# Patient Record
Sex: Female | Born: 1990 | Race: White | Hispanic: No | Marital: Single | Smoking: Current every day smoker
Health system: Southern US, Community
[De-identification: ages and names within clinical notes are randomized; demographics above are authoritative.]

## PROBLEM LIST (undated history)

## (undated) ENCOUNTER — Inpatient Hospital Stay: Payer: Self-pay

## (undated) DIAGNOSIS — F1290 Cannabis use, unspecified, uncomplicated: Secondary | ICD-10-CM

## (undated) DIAGNOSIS — E079 Disorder of thyroid, unspecified: Secondary | ICD-10-CM

## (undated) DIAGNOSIS — Z720 Tobacco use: Secondary | ICD-10-CM

## (undated) HISTORY — PX: NO PAST SURGERIES: SHX2092

## (undated) HISTORY — PX: TUBAL LIGATION: SHX77

## (undated) HISTORY — PX: WISDOM TOOTH EXTRACTION: SHX21

## (undated) HISTORY — DX: Disorder of thyroid, unspecified: E07.9

---
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
## 2010-12-19 ENCOUNTER — Inpatient Hospital Stay: Payer: Self-pay

## 2012-04-26 ENCOUNTER — Emergency Department: Payer: Self-pay | Admitting: Emergency Medicine

## 2012-04-26 LAB — CBC
HCT: 45.8 % (ref 35.0–47.0)
HGB: 14.8 g/dL (ref 12.0–16.0)
MCHC: 32.2 g/dL (ref 32.0–36.0)
MCV: 81 fL (ref 80–100)
RBC: 5.64 10*6/uL — ABNORMAL HIGH (ref 3.80–5.20)

## 2012-04-26 LAB — URINALYSIS, COMPLETE
Glucose,UR: NEGATIVE mg/dL (ref 0–75)
Ph: 6 (ref 4.5–8.0)
Protein: >=500
Specific Gravity: 1.027 (ref 1.003–1.030)
Squamous Epithelial: 15

## 2012-04-26 LAB — COMPREHENSIVE METABOLIC PANEL
Albumin: 4.3 g/dL (ref 3.4–5.0)
Alkaline Phosphatase: 158 U/L — ABNORMAL HIGH (ref 50–136)
Calcium, Total: 8.8 mg/dL (ref 8.5–10.1)
Chloride: 105 mmol/L (ref 98–107)
Co2: 24 mmol/L (ref 21–32)
Creatinine: 0.9 mg/dL (ref 0.60–1.30)
EGFR (African American): >60
EGFR (Non-African Amer.): >60
Osmolality: 276 (ref 275–301)
SGOT(AST): >2000 U/L — ABNORMAL HIGH (ref 15–37)
SGPT (ALT): >3500 U/L

## 2012-04-26 LAB — LIPASE, BLOOD: Lipase: 117 U/L (ref 73–393)

## 2012-10-09 ENCOUNTER — Ambulatory Visit: Payer: Self-pay | Admitting: Internal Medicine

## 2013-03-07 ENCOUNTER — Emergency Department: Payer: Self-pay | Admitting: Emergency Medicine

## 2013-03-07 LAB — URINALYSIS, COMPLETE
Bilirubin,UR: NEGATIVE
Glucose,UR: NEGATIVE mg/dL (ref 0–75)
Ketone: NEGATIVE
Ph: 6 (ref 4.5–8.0)
Protein: 30
RBC,UR: 6 /HPF (ref 0–5)
Specific Gravity: 1.023 (ref 1.003–1.030)

## 2013-03-07 LAB — CBC
HCT: 40.6 % (ref 35.0–47.0)
HGB: 14.1 g/dL (ref 12.0–16.0)
MCHC: 34.7 g/dL (ref 32.0–36.0)
MCV: 90 fL (ref 80–100)
Platelet: 209 10*3/uL (ref 150–440)
WBC: 12.5 10*3/uL — ABNORMAL HIGH (ref 3.6–11.0)

## 2013-03-07 LAB — WET PREP, GENITAL

## 2013-03-07 LAB — HCG, QUANTITATIVE, PREGNANCY: Beta Hcg, Quant.: 5635 m[IU]/mL — ABNORMAL HIGH

## 2013-09-07 IMAGING — US US OB < 14 WEEKS
1 series · 14 of 28 positions shown · non-contrast
Comparison: none

REASON FOR EXAM: vaginal bleeding LMP 12/14/12
COMMENTS:

PROCEDURE:     US  - US OB LESS THAN 14 WEEKS  - March 07, 2013  [DATE]
RESULT:     Limited first trimester OB ultrasound dated 03/07/2013
TECHNIQUE: Transabdominal imaging of the pelvis was obtained. This study
included duplex color flow Doppler imaging.

[Series 1: us ob < 14 weeks · 0.23mm/px · 14 of 70 slices shown]
[im 3/70]
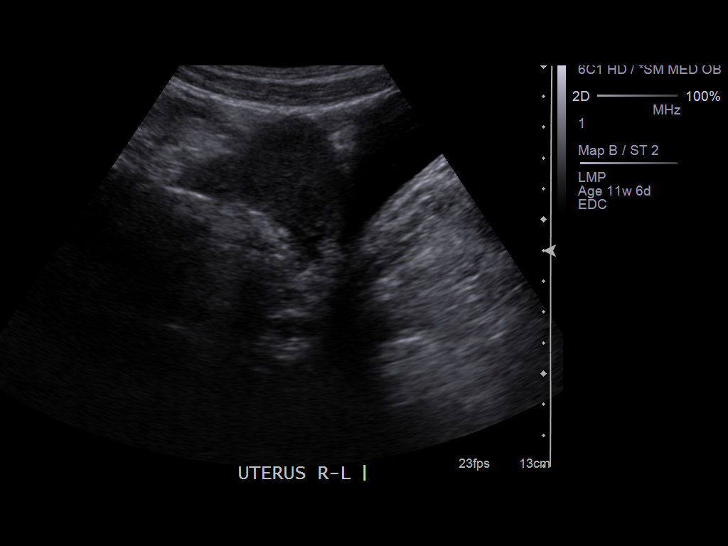
[im 8/70]
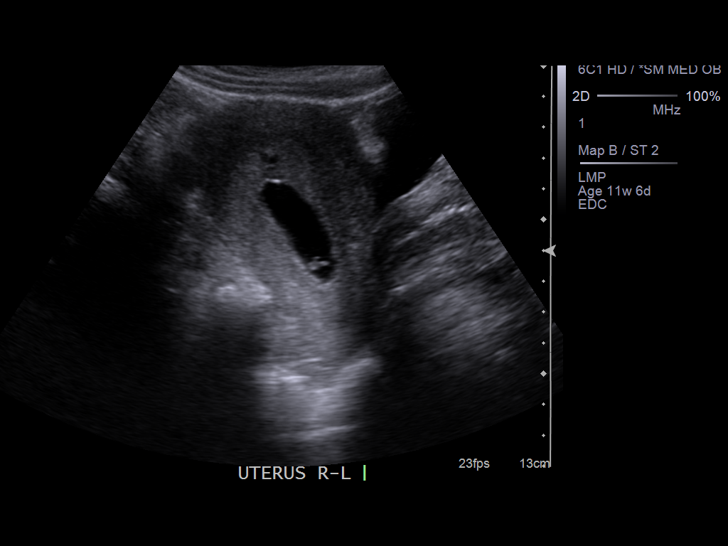
[im 13/70]
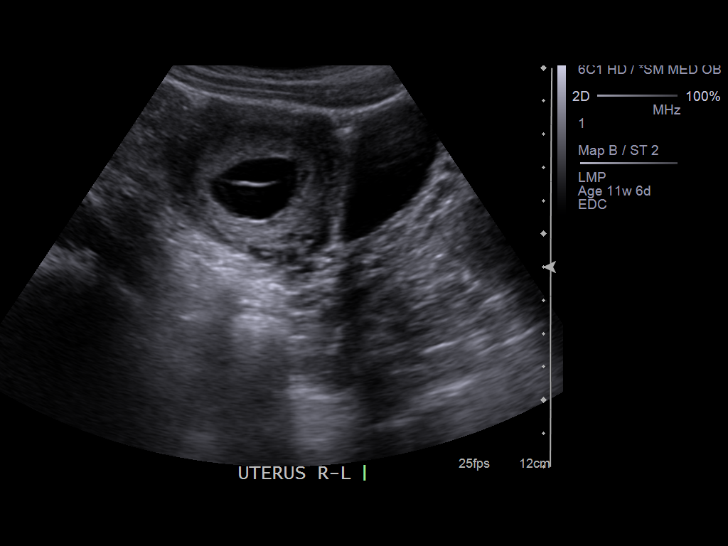
[im 18/70]
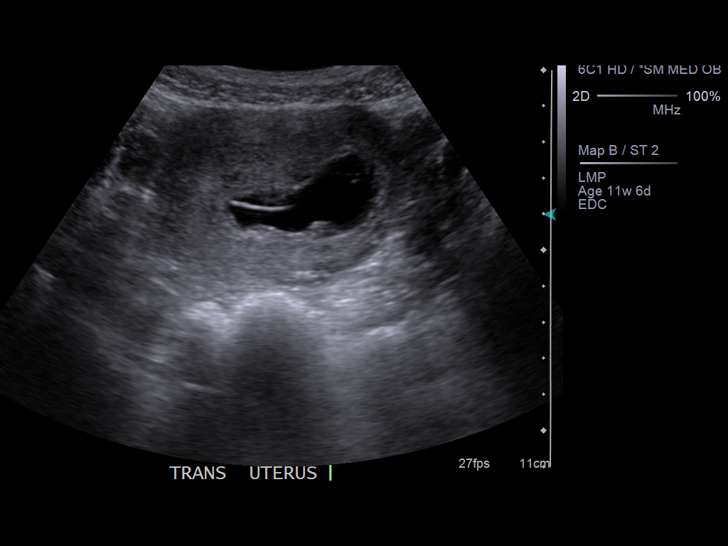
[im 24/70]
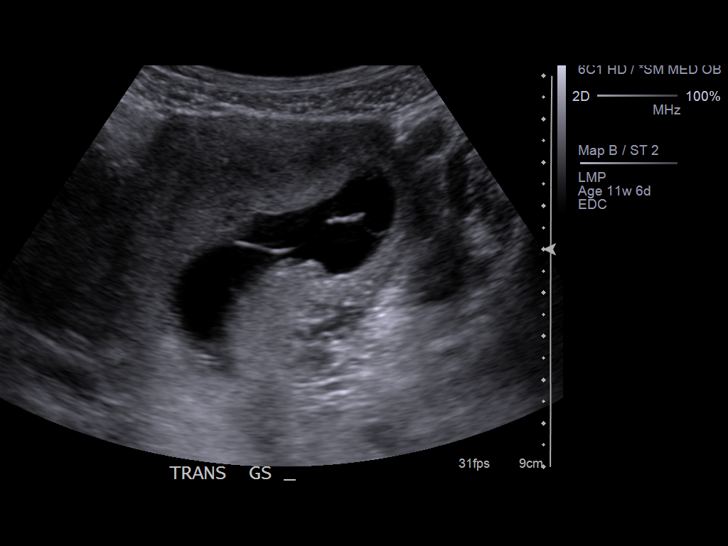
[im 29/70]
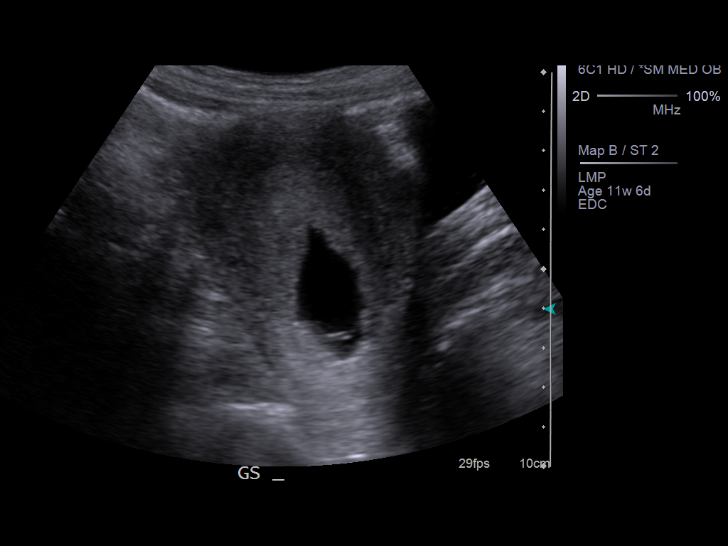
[im 34/70]
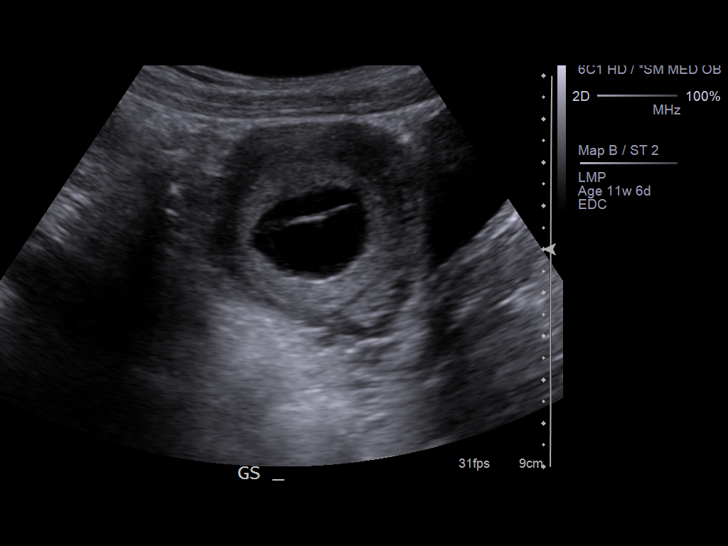
[im 39/70]
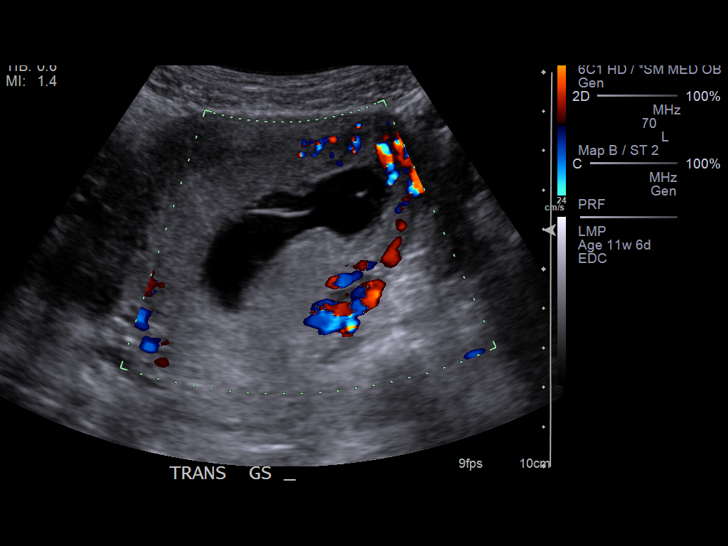
[im 44/70]
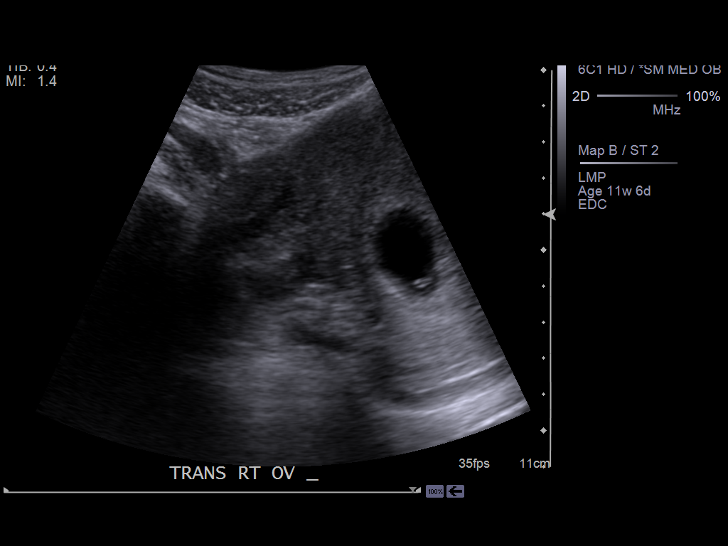
[im 49/70]
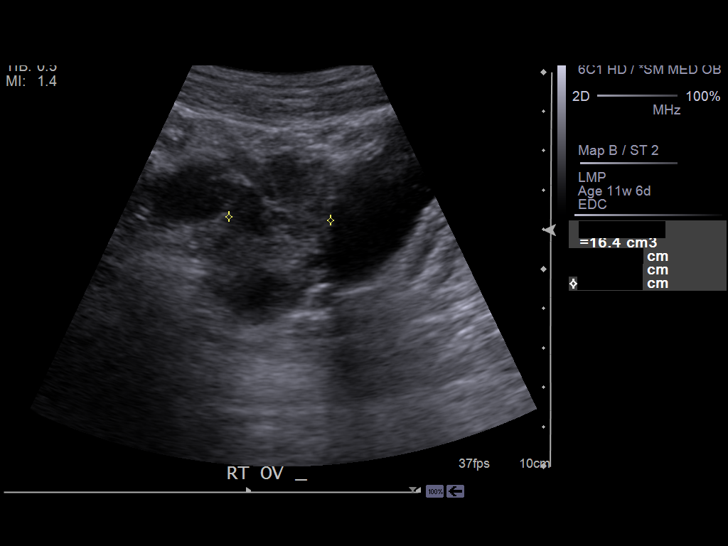
[im 54/70]
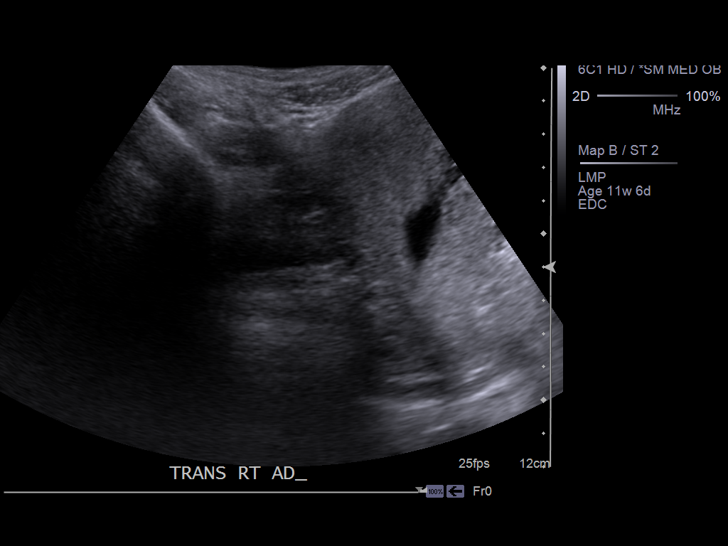
[im 59/70]
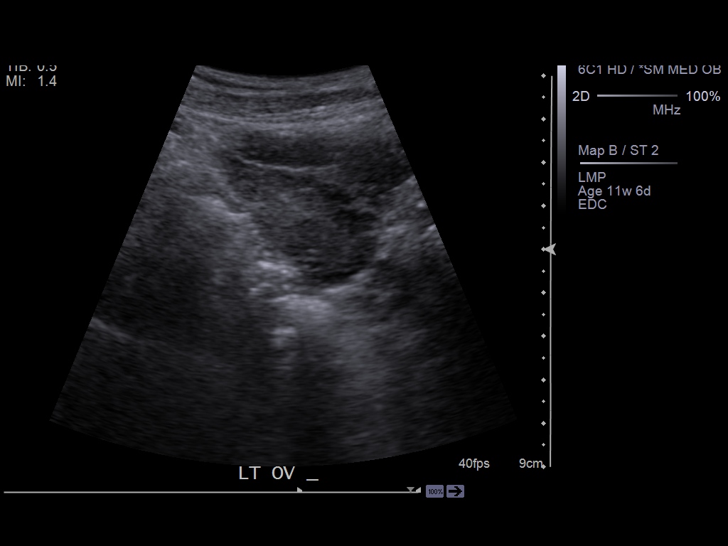
[im 64/70]
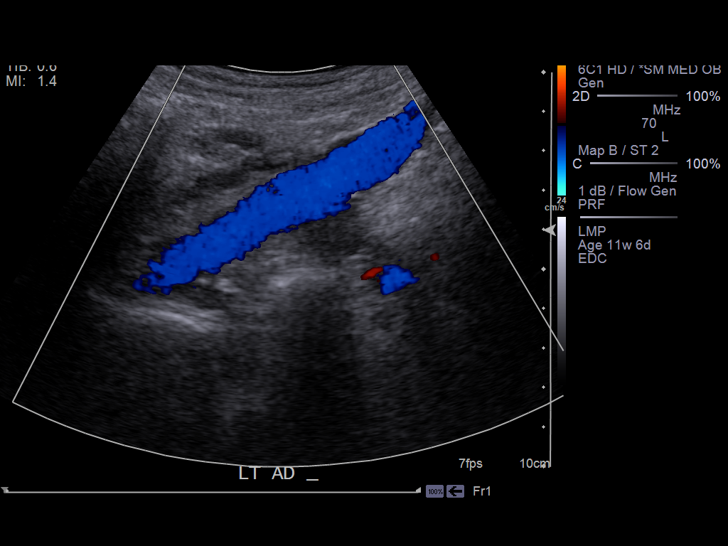
[im 70/70]
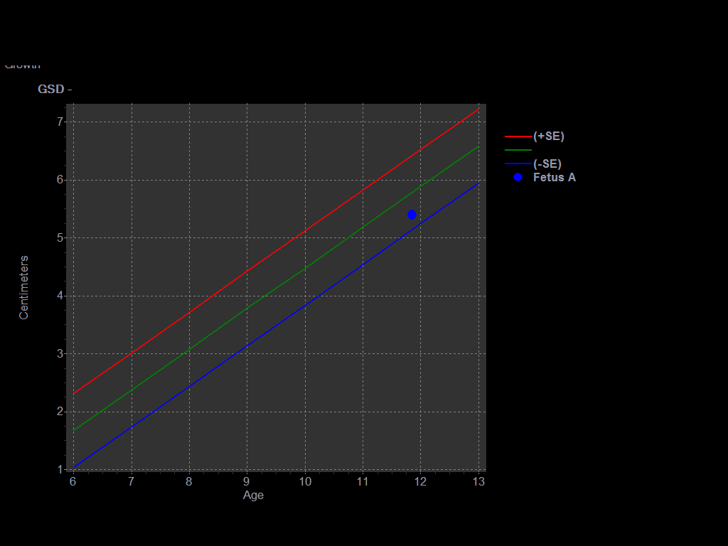

[14 of 28 positions shown; findings below may reference images not displayed]

FINDINGS: An oval shaped irregularly bordered intrauterine cystic structure
is appreciated consistent with intrauterine gestational sac. Mean sac
diameter of 5.4 cm corresponds with an estimated gestational age of 11 weeks
2 days. Internal echoes are appreciated within the gestational sac as well
as linear areas of increased echogenicity. There is no appreciable fetal
pole nor yolk sac. The uterus otherwise demonstrates a homogeneous
echotexture. The adnexal regions are unremarkable. The right ovary measures
3.04 x 4 x 2.58 cm and the left 1.77 x 2.95 x 2.27 cm. There is no evidence
of pelvic free fluid, nor solid or cystic masses.
IMPRESSION: Sonographic findings which appear to represent a
miscarriage versus an ovulatory pregnancy. Correlation with serial beta-hCG
is recommended. If clinically warranted OB consultation.

## 2014-10-28 NOTE — L&D Delivery Note (Signed)
Delivery Note At 4:49 PM a viable female was delivered via Vaginal, Spontaneous Delivery (Presentation: Left Occiput Anterior).  APGAR: 7, 9; weight 7 lb 7.8 oz (3396 g).   Placenta status: Intact, Spontaneous.  Cord: 3 vessels with the following complications: Short cord causing some decelerations with second stage pushing  Anesthesia: Epidural  Episiotomy: None Lacerations: 1st degree;Labial;Perineal Suture Repair: 3.0 chromic Est. Blood Loss (mL): 350  Mom in recovery. Baby to Couplet care / Skin to Skin. Alexis Parsons/ Bottle  Alexis Parsons 07/30/2015, 5:33 PM

## 2015-01-30 LAB — OB RESULTS CONSOLE RUBELLA ANTIBODY, IGM: RUBELLA: IMMUNE

## 2015-01-30 LAB — OB RESULTS CONSOLE ABO/RH: RH TYPE: NEGATIVE

## 2015-01-30 LAB — OB RESULTS CONSOLE HEPATITIS B SURFACE ANTIGEN: Hepatitis B Surface Ag: NEGATIVE

## 2015-01-30 LAB — OB RESULTS CONSOLE HIV ANTIBODY (ROUTINE TESTING): HIV: NONREACTIVE

## 2015-01-30 LAB — OB RESULTS CONSOLE VARICELLA ZOSTER ANTIBODY, IGG: Varicella: IMMUNE

## 2015-01-30 LAB — OB RESULTS CONSOLE RPR: RPR: NONREACTIVE

## 2015-03-28 ENCOUNTER — Encounter: Payer: Self-pay | Admitting: Certified Nurse Midwife

## 2015-03-28 ENCOUNTER — Observation Stay
Admission: EM | Admit: 2015-03-28 | Discharge: 2015-03-28 | Disposition: A | Discharge status: Home / Self Care | Payer: Medicaid Other | Attending: Obstetrics & Gynecology | Admitting: Obstetrics & Gynecology

## 2015-03-28 DIAGNOSIS — Z3A21 21 weeks gestation of pregnancy: Secondary | ICD-10-CM | POA: Diagnosis not present

## 2015-03-28 DIAGNOSIS — O4692 Antepartum hemorrhage, unspecified, second trimester: Secondary | ICD-10-CM | POA: Diagnosis not present

## 2015-03-28 DIAGNOSIS — O4690 Antepartum hemorrhage, unspecified, unspecified trimester: Secondary | ICD-10-CM | POA: Diagnosis present

## 2015-03-28 LAB — URINALYSIS COMPLETE WITH MICROSCOPIC (ARMC ONLY)
BILIRUBIN URINE: NEGATIVE
Bacteria, UA: NONE SEEN
GLUCOSE, UA: NEGATIVE mg/dL
KETONES UR: NEGATIVE mg/dL
Leukocytes, UA: NEGATIVE
Nitrite: NEGATIVE
Protein, ur: NEGATIVE mg/dL
Specific Gravity, Urine: 1.023 (ref 1.005–1.030)
pH: 6 (ref 5.0–8.0)

## 2015-03-28 LAB — CBC
HCT: 35 % (ref 35.0–47.0)
Hemoglobin: 11.7 g/dL — ABNORMAL LOW (ref 12.0–16.0)
MCH: 30.7 pg (ref 26.0–34.0)
MCHC: 33.5 g/dL (ref 32.0–36.0)
MCV: 91.6 fL (ref 80.0–100.0)
PLATELETS: 279 10*3/uL (ref 150–440)
RBC: 3.83 MIL/uL (ref 3.80–5.20)
RDW: 13 % (ref 11.5–14.5)
WBC: 10.5 10*3/uL (ref 3.6–11.0)

## 2015-03-28 LAB — CHLAMYDIA/NGC RT PCR (ARMC ONLY)
Chlamydia Tr: NOT DETECTED
N gonorrhoeae: NOT DETECTED

## 2015-03-28 LAB — KLEIHAUER-BETKE STAIN
# Vials RhIg: 1
Fetal Cells %: 0 %
Quantitation Fetal Hemoglobin: 0 mL

## 2015-03-28 LAB — TYPE AND SCREEN
ABO/RH(D): A NEG
Antibody Screen: NEGATIVE

## 2015-03-28 LAB — ABO/RH: ABO/RH(D): A NEG

## 2015-03-28 MED ORDER — RHO D IMMUNE GLOBULIN 1500 UNIT/2ML IJ SOSY
300.0000 ug | PREFILLED_SYRINGE | Freq: Once | INTRAMUSCULAR | Status: AC
  Administered 2015-03-28: 300 ug via INTRAMUSCULAR
  Filled 2015-03-28: qty 2

## 2015-03-28 MED ORDER — METRONIDAZOLE 500 MG PO TABS: 500.0000 mg | ORAL_TABLET | Freq: Two times a day (BID) | ORAL | Status: DC

## 2015-03-28 MED ORDER — ACETAMINOPHEN 325 MG PO TABS: 650.0000 mg | ORAL_TABLET | ORAL | Status: DC | PRN

## 2015-03-28 NOTE — H&P (Signed)
Obstetric History and Physical  Brittley HOORAIN KOZAKIEWICZ is a 24 y.o. G3P0011 with IUP at [redacted]w[redacted]d presenting for vaginal bleeding that started last night. She denies any recent intercourse, denies vaginal discharge, odor, itch, burn, dysuria, back pain, trauma, or vigorous activity. She reports that the bleeding was bright red with clots. Patient states she has been having  none contractions, intact membranes, with active fetal movement.    Prenatal Course Source of Care: [facility]  with onset of care at 13 weeks Pregnancy complications or risks:  Prenatal course complicated by MJ and tobacco use.  Patient Active Problem List   Diagnosis Date Noted  . Vaginal bleeding in pregnancy 03/28/2015     Prenatal labs and studies: ABO, Rh: A/Negative/-- (04/04 0000) Antibody:   Rubella: Immune (04/04 0000) Varicella: immune  RPR: Nonreactive (04/04 0000)  HBsAg: Negative (04/04 0000)  HIV: Non-reactive (04/04 0000)  GBS:  1 hr Glucola  Not performed yet Genetic screening normal Anatomy US normal Tdap: not given   Flu: not given   History reviewed. No pertinent past medical history.  History reviewed. No pertinent past surgical history.  OB History  Gravida Para Term Preterm AB SAB TAB Ectopic Multiple Living  # Outcome Date GA Lbr Len/2nd Weight Sex Delivery Anes PTL Lv  3 Current           2 AB 2014     SAB        Comments: 1st trimester   1 Gravida 12/20/10   2.892 kg (6 lb 6 oz) M Vag-Spont   Y      History   Social History  . Marital Status: Single    Spouse Name: N/A  . Number of Children: N/A  . Years of Education: N/A   Social History Main Topics  . Smoking status: Current Every Day Smoker -- 0.50 packs/day    Types: Cigarettes  . Smokeless tobacco: Not on file  . Alcohol Use: No  . Drug Use: Yes    Special: Marijuana     Comment: + UDS 01/30/15  . Sexual Activity: Yes    Birth Control/ Protection: None   Other Topics Concern  . None   Social History  Narrative  . None    Family History  Problem Relation Age of Onset  . Hyperlipidemia Father   . Hypertension Paternal Grandfather     Prescriptions prior to admission  Medication Sig Dispense Refill Last Dose  . Prenatal Vit-Fe Fumarate-FA (MULTIVITAMIN-PRENATAL) 27-0.8 MG TABS tablet Take 1 tablet by mouth daily at 12 noon.   03/27/2015 at Unknown time    No Known Allergies  Review of Systems: Negative except for what is mentioned in HPI.  Physical Exam: BP   Temp(Src) 97.8 F (36.6 C) (Oral)  Ht  (1.575 m)  Wt 52.164 kg (115 lb)  BMI 21.03 kg/m2 GENERAL: Well-developed, well-nourished female in no acute distress.  LUNGS: Clear to auscultation bilaterally.  HEART: Regular rate and rhythm. ABDOMEN: Soft, nontender, nondistended, gravid. EXTREMITIES: Nontender, no edema, 2+ distal pulses. Dilation: Closed Effacement (%): Thick Cervical Position: Middle Station: Ballotable Presentation: Undeterminable Exam by:: Galilea Quito CNM, Kaisei Gilbo Presentation: variable FHT:  Baseline rate 150 bpm   Variability moderate  Appropriate for gestational age Contractions: None seen   Pertinent Labs/Studies:   Results for orders placed or performed during the hospital encounter of 03/28/15 (from the past 24 hour(s))  Urinalysis complete, with microscopic (  [facility])     Status: Abnormal   Collection Time: 03/28/15  9:05 AM  Result Value Ref Range   Color, Urine YELLOW (A) YELLOW   APPearance CLEAR (A) CLEAR   Glucose, UA NEGATIVE NEGATIVE mg/dL   Bilirubin Urine NEGATIVE NEGATIVE   Ketones, ur NEGATIVE NEGATIVE mg/dL   Specific Gravity, Urine 1.023 1.005 - 1.030   Hgb urine dipstick 1+ (A) NEGATIVE   pH 6.0 5.0 - 8.0   Protein, ur NEGATIVE NEGATIVE mg/dL   Nitrite NEGATIVE NEGATIVE   Leukocytes, UA NEGATIVE NEGATIVE   RBC / HPF 0-5 0 - 5 RBC/hpf   WBC, UA 0-5 0 - 5 WBC/hpf   Bacteria, UA NONE SEEN NONE SEEN   Squamous Epithelial / LPF 0-5 (A) NONE SEEN   Mucous PRESENT     Microscopic wet-mount exam shows clue cells, white blood cells, thin white discharge .  Assessment : Ellamae Dwan BoltM Kittel is a 24 y.o. G3P0011 at 382w1d being seen for vaginal bleeding   Plan:  Pt with BV- will treat with flagyl Awaiting labs Will give Rhogam if indicated for vaginal bleeding with A negative blood type once labs return  Dispo: discharge after labs are back   Jannet Mantisourtney Qamar Aughenbaugh, CNM [facility]

## 2015-03-29 LAB — RHOGAM INJECTION: UNIT DIVISION: 0

## 2015-07-13 LAB — OB RESULTS CONSOLE GBS: GBS: NEGATIVE

## 2015-07-29 ENCOUNTER — Inpatient Hospital Stay
Admission: EM | Admit: 2015-07-29 | Discharge: 2015-07-31 | DRG: 775 | Disposition: A | Discharge status: Home / Self Care | Payer: Medicaid Other | Attending: Certified Nurse Midwife | Admitting: Certified Nurse Midwife

## 2015-07-29 DIAGNOSIS — Z3A38 38 weeks gestation of pregnancy: Secondary | ICD-10-CM

## 2015-07-29 DIAGNOSIS — O4292 Full-term premature rupture of membranes, unspecified as to length of time between rupture and onset of labor: Principal | ICD-10-CM | POA: Diagnosis present

## 2015-07-29 DIAGNOSIS — O99324 Drug use complicating childbirth: Secondary | ICD-10-CM | POA: Diagnosis present

## 2015-07-29 DIAGNOSIS — O4290 Premature rupture of membranes, unspecified as to length of time between rupture and onset of labor, unspecified weeks of gestation: Secondary | ICD-10-CM | POA: Diagnosis present

## 2015-07-29 DIAGNOSIS — F17210 Nicotine dependence, cigarettes, uncomplicated: Secondary | ICD-10-CM | POA: Diagnosis present

## 2015-07-29 DIAGNOSIS — O360930 Maternal care for other rhesus isoimmunization, third trimester, not applicable or unspecified: Secondary | ICD-10-CM | POA: Diagnosis present

## 2015-07-29 DIAGNOSIS — O99334 Smoking (tobacco) complicating childbirth: Secondary | ICD-10-CM | POA: Diagnosis present

## 2015-07-29 DIAGNOSIS — D62 Acute posthemorrhagic anemia: Secondary | ICD-10-CM | POA: Diagnosis not present

## 2015-07-29 DIAGNOSIS — F1290 Cannabis use, unspecified, uncomplicated: Secondary | ICD-10-CM | POA: Diagnosis present

## 2015-07-29 DIAGNOSIS — Z8249 Family history of ischemic heart disease and other diseases of the circulatory system: Secondary | ICD-10-CM

## 2015-07-29 HISTORY — DX: Tobacco use: Z72.0

## 2015-07-29 HISTORY — DX: Cannabis use, unspecified, uncomplicated: F12.90

## 2015-07-30 ENCOUNTER — Encounter: Payer: Self-pay | Admitting: Certified Nurse Midwife

## 2015-07-30 ENCOUNTER — Inpatient Hospital Stay: Payer: Medicaid Other | Admitting: Anesthesiology

## 2015-07-30 DIAGNOSIS — Z8249 Family history of ischemic heart disease and other diseases of the circulatory system: Secondary | ICD-10-CM | POA: Diagnosis not present

## 2015-07-30 DIAGNOSIS — O99324 Drug use complicating childbirth: Secondary | ICD-10-CM | POA: Diagnosis present

## 2015-07-30 DIAGNOSIS — D62 Acute posthemorrhagic anemia: Secondary | ICD-10-CM | POA: Diagnosis not present

## 2015-07-30 DIAGNOSIS — F1290 Cannabis use, unspecified, uncomplicated: Secondary | ICD-10-CM | POA: Diagnosis present

## 2015-07-30 DIAGNOSIS — O360930 Maternal care for other rhesus isoimmunization, third trimester, not applicable or unspecified: Secondary | ICD-10-CM | POA: Diagnosis present

## 2015-07-30 DIAGNOSIS — O4292 Full-term premature rupture of membranes, unspecified as to length of time between rupture and onset of labor: Secondary | ICD-10-CM | POA: Diagnosis present

## 2015-07-30 DIAGNOSIS — O99334 Smoking (tobacco) complicating childbirth: Secondary | ICD-10-CM | POA: Diagnosis present

## 2015-07-30 DIAGNOSIS — O4290 Premature rupture of membranes, unspecified as to length of time between rupture and onset of labor, unspecified weeks of gestation: Secondary | ICD-10-CM | POA: Diagnosis present

## 2015-07-30 DIAGNOSIS — Z3A38 38 weeks gestation of pregnancy: Secondary | ICD-10-CM | POA: Diagnosis not present

## 2015-07-30 DIAGNOSIS — F17210 Nicotine dependence, cigarettes, uncomplicated: Secondary | ICD-10-CM | POA: Diagnosis present

## 2015-07-30 LAB — URINE DRUG SCREEN, QUALITATIVE (ARMC ONLY)
AMPHETAMINES, UR SCREEN: NOT DETECTED
BARBITURATES, UR SCREEN: NOT DETECTED
BENZODIAZEPINE, UR SCRN: NOT DETECTED
Cannabinoid 50 Ng, Ur ~~LOC~~: NOT DETECTED
Cocaine Metabolite,Ur ~~LOC~~: NOT DETECTED
MDMA (Ecstasy)Ur Screen: NOT DETECTED
METHADONE SCREEN, URINE: NOT DETECTED
Opiate, Ur Screen: NOT DETECTED
Phencyclidine (PCP) Ur S: NOT DETECTED
TRICYCLIC, UR SCREEN: NOT DETECTED

## 2015-07-30 LAB — CBC
HEMATOCRIT: 33.1 % — AB (ref 35.0–47.0)
HEMOGLOBIN: 11.3 g/dL — AB (ref 12.0–16.0)
MCH: 27.6 pg (ref 26.0–34.0)
MCHC: 34.2 g/dL (ref 32.0–36.0)
MCV: 80.6 fL (ref 80.0–100.0)
Platelets: 218 10*3/uL (ref 150–440)
RBC: 4.1 MIL/uL (ref 3.80–5.20)
RDW: 16.3 % — ABNORMAL HIGH (ref 11.5–14.5)
WBC: 9.5 10*3/uL (ref 3.6–11.0)

## 2015-07-30 LAB — TYPE AND SCREEN
ABO/RH(D): A NEG
Antibody Screen: POSITIVE

## 2015-07-30 LAB — CHLAMYDIA/NGC RT PCR (ARMC ONLY)
CHLAMYDIA TR: NOT DETECTED
N gonorrhoeae: NOT DETECTED

## 2015-07-30 MED ORDER — BUTORPHANOL TARTRATE 1 MG/ML IJ SOLN
1.0000 mg | Freq: Once | INTRAMUSCULAR | Status: AC
  Administered 2015-07-30: 1 mg via INTRAVENOUS

## 2015-07-30 MED ORDER — FENTANYL CITRATE (PF) 100 MCG/2ML IJ SOLN: 50.0000 ug | INTRAMUSCULAR | Status: DC | PRN

## 2015-07-30 MED ORDER — FENTANYL 2.5 MCG/ML W/ROPIVACAINE 0.2% IN NS 100 ML EPIDURAL INFUSION (ARMC-ANES)
10.0000 mL/h | EPIDURAL | Status: DC
  Administered 2015-07-30: 10 mL/h via EPIDURAL

## 2015-07-30 MED ORDER — LIDOCAINE HCL (PF) 1 % IJ SOLN: 30.0000 mL | INTRAMUSCULAR | Status: DC | PRN

## 2015-07-30 MED ORDER — MEPERIDINE HCL 25 MG/ML IJ SOLN: 6.2500 mg | INTRAMUSCULAR | Status: DC | PRN

## 2015-07-30 MED ORDER — BUTORPHANOL TARTRATE 1 MG/ML IJ SOLN
INTRAMUSCULAR | Status: AC
  Administered 2015-07-30: 1 mg via INTRAVENOUS
  Filled 2015-07-30: qty 1

## 2015-07-30 MED ORDER — SCOPOLAMINE 1 MG/3DAYS TD PT72
1.0000 | MEDICATED_PATCH | Freq: Once | TRANSDERMAL | Status: DC
  Filled 2015-07-30: qty 1

## 2015-07-30 MED ORDER — MISOPROSTOL 25 MCG QUARTER TABLET
25.0000 ug | ORAL_TABLET | Freq: Once | ORAL | Status: AC
  Administered 2015-07-30: 25 ug via ORAL
  Filled 2015-07-30: qty 1

## 2015-07-30 MED ORDER — NALOXONE HCL 0.4 MG/ML IJ SOLN: 0.4000 mg | INTRAMUSCULAR | Status: DC | PRN

## 2015-07-30 MED ORDER — SODIUM CHLORIDE 0.9 % IJ SOLN: 3.0000 mL | INTRAMUSCULAR | Status: DC | PRN

## 2015-07-30 MED ORDER — AMMONIA AROMATIC IN INHA: 0.3000 mL | Freq: Once | RESPIRATORY_TRACT | Status: DC | PRN

## 2015-07-30 MED ORDER — LACTATED RINGERS IV SOLN
500.0000 mL | INTRAVENOUS | Status: DC | PRN
  Administered 2015-07-30: 500 mL via INTRAVENOUS

## 2015-07-30 MED ORDER — IBUPROFEN 600 MG PO TABS
ORAL_TABLET | ORAL | Status: AC
  Filled 2015-07-30: qty 1

## 2015-07-30 MED ORDER — OXYTOCIN BOLUS FROM INFUSION
500.0000 mL | INTRAVENOUS | Status: DC
  Administered 2015-07-30: 500 mL via INTRAVENOUS

## 2015-07-30 MED ORDER — NALBUPHINE HCL 10 MG/ML IJ SOLN
5.0000 mg | Freq: Once | INTRAMUSCULAR | Status: DC | PRN
  Filled 2015-07-30: qty 0.5

## 2015-07-30 MED ORDER — ONDANSETRON HCL 4 MG/2ML IJ SOLN: 4.0000 mg | Freq: Four times a day (QID) | INTRAMUSCULAR | Status: DC | PRN

## 2015-07-30 MED ORDER — BUPIVACAINE HCL (PF) 0.25 % IJ SOLN
INTRAMUSCULAR | Status: DC | PRN
  Administered 2015-07-30 (×2): 2.5 mL via EPIDURAL

## 2015-07-30 MED ORDER — INFLUENZA VAC SPLIT QUAD 0.5 ML IM SUSY: 0.5000 mL | PREFILLED_SYRINGE | INTRAMUSCULAR | Status: DC

## 2015-07-30 MED ORDER — LIDOCAINE HCL (PF) 1 % IJ SOLN
INTRAMUSCULAR | Status: DC | PRN
  Administered 2015-07-30: 1 mL via INTRADERMAL

## 2015-07-30 MED ORDER — NALBUPHINE HCL 10 MG/ML IJ SOLN
5.0000 mg | INTRAMUSCULAR | Status: DC | PRN
  Filled 2015-07-30: qty 0.5

## 2015-07-30 MED ORDER — SODIUM CHLORIDE 0.9 % IJ SOLN
INTRAMUSCULAR | Status: AC
  Filled 2015-07-30: qty 6

## 2015-07-30 MED ORDER — DIPHENHYDRAMINE HCL 50 MG/ML IJ SOLN
12.5000 mg | INTRAMUSCULAR | Status: DC | PRN
Start: 2015-07-30 — End: 2015-07-31

## 2015-07-30 MED ORDER — LACTATED RINGERS IV SOLN: INTRAVENOUS | Status: DC

## 2015-07-30 MED ORDER — OXYTOCIN 40 UNITS IN LACTATED RINGERS INFUSION - SIMPLE MED
62.5000 mL/h | INTRAVENOUS | Status: DC
  Administered 2015-07-30: 62.5 mL/h via INTRAVENOUS
  Filled 2015-07-30: qty 1000

## 2015-07-30 MED ORDER — TERBUTALINE SULFATE 1 MG/ML IJ SOLN: 0.2500 mg | Freq: Once | INTRAMUSCULAR | Status: DC | PRN

## 2015-07-30 MED ORDER — LIDOCAINE-EPINEPHRINE (PF) 1.5 %-1:200000 IJ SOLN
INTRAMUSCULAR | Status: DC | PRN
  Administered 2015-07-30: 3 mL via EPIDURAL

## 2015-07-30 MED ORDER — FENTANYL 2.5 MCG/ML W/ROPIVACAINE 0.2% IN NS 100 ML EPIDURAL INFUSION (ARMC-ANES)
EPIDURAL | Status: AC
  Administered 2015-07-30: 10 mL/h via EPIDURAL
  Filled 2015-07-30: qty 100

## 2015-07-30 MED ORDER — NALOXONE HCL 1 MG/ML IJ SOLN
1.0000 ug/kg/h | INTRAVENOUS | Status: DC | PRN
  Filled 2015-07-30: qty 2

## 2015-07-30 MED ORDER — IBUPROFEN 600 MG PO TABS
600.0000 mg | ORAL_TABLET | Freq: Four times a day (QID) | ORAL | Status: DC
Start: 2015-07-30 — End: 2015-07-31
  Administered 2015-07-30 – 2015-07-31 (×2): 600 mg via ORAL
  Filled 2015-07-30 (×2): qty 1

## 2015-07-30 MED ORDER — DIPHENHYDRAMINE HCL 25 MG PO CAPS: 25.0000 mg | ORAL_CAPSULE | ORAL | Status: DC | PRN

## 2015-07-30 MED ORDER — OXYTOCIN 40 UNITS IN LACTATED RINGERS INFUSION - SIMPLE MED
1.0000 m[IU]/min | INTRAVENOUS | Status: DC
  Administered 2015-07-30: 1 m[IU]/min via INTRAVENOUS

## 2015-07-30 MED ORDER — LACTATED RINGERS IV SOLN
INTRAVENOUS | Status: DC
  Administered 2015-07-30: 125 mL/h via INTRAVENOUS
  Administered 2015-07-30: 09:00:00 via INTRAVENOUS

## 2015-07-30 MED ORDER — ONDANSETRON HCL 4 MG/2ML IJ SOLN: 4.0000 mg | Freq: Three times a day (TID) | INTRAMUSCULAR | Status: DC | PRN

## 2015-07-30 MED ORDER — LACTATED RINGERS IV SOLN: 500.0000 mL | INTRAVENOUS | Status: DC | PRN

## 2015-07-30 MED ORDER — HYDROCODONE-ACETAMINOPHEN 5-325 MG PO TABS: 1.0000 | ORAL_TABLET | ORAL | Status: DC | PRN

## 2015-07-30 NOTE — Progress Notes (Signed)
L&D Progress NOte  S: Contractions getting stronger  O: BP 122/82 mmHg  Pulse 69  Temp(Src) 97.4 F (36.3 C) (Oral)  Resp 16  Ht  (1.6 m)  Wt 135 lb (61.236 kg)  BMI 23.92 kg/m2  General: in NAD FHR: 130s baseline with accels to 160s t 170, mod variability, no significant or persistent decelerations Toco: q2-7 min apart with inconsistent duration Results for orders placed or performed during the hospital encounter of 07/29/15 (from the past 24 hour(s))  CBC     Status: Abnormal   Collection Time: 07/30/15  1:58 AM  Result Value Ref Range   WBC 9.5 3.6 - 11.0 K/uL   RBC 4.10 3.80 - 5.20 MIL/uL   Hemoglobin 11.3 (L) 12.0 - 16.0 g/dL   HCT 84.6 (L) 96.2 - 95.2 %   MCV 80.6 80.0 - 100.0 fL   MCH 27.6 26.0 - 34.0 pg   MCHC 34.2 32.0 - 36.0 g/dL   RDW 84.1 (H) 32.4 - 40.1 %   Platelets 218 150 - 440 K/uL  Chlamydia/NGC rt PCR ([facility] only)     Status: None   Collection Time: 07/30/15  1:58 AM  Result Value Ref Range   Specimen source GC/Chlam ENDOCERVICAL    Chlamydia Tr NOT DETECTED NOT DETECTED   N gonorrhoeae NOT DETECTED NOT DETECTED  Type and screen     Status: None   Collection Time: 07/30/15  2:00 AM  Result Value Ref Range   ABO/RH(D) A NEG    Antibody Screen POS    Sample Expiration 08/02/2015    Antibody Identification PASSIVELY ACQUIRED ANTI-D   Urine Drug Screen, Qualitative ([facility] only)     Status: None   Collection Time: 07/30/15  3:13 AM  Result Value Ref Range   Tricyclic, Ur Screen NONE DETECTED NONE DETECTED   Amphetamines, Ur Screen NONE DETECTED NONE DETECTED   MDMA (Ecstasy)Ur Screen NONE DETECTED NONE DETECTED   Cocaine Metabolite,Ur [place] NONE DETECTED NONE DETECTED   Opiate, Ur Screen NONE DETECTED NONE DETECTED   Phencyclidine (PCP) Ur S NONE DETECTED NONE DETECTED   Cannabinoid 50 Ng, Ur [place] NONE DETECTED NONE DETECTED   Barbiturates, Ur Screen NONE DETECTED NONE DETECTED   Benzodiazepine, Ur Scrn NONE DETECTED NONE DETECTED   Methadone Scn, Ur  NONE DETECTED NONE DETECTED   A: Prolonged PROM-contracting more after first dose of Cytotec, although still irregularly  P: Can shower and eat light breakfast Repeat Cytotec 25 mcg orally after that Stadol for pain Epidural when appropriate  Ladarious Kresse, CNM

## 2015-07-30 NOTE — Progress Notes (Signed)
SVD of viable female infant. Delayed cord clamping, placed skin to skin.

## 2015-07-30 NOTE — Anesthesia Preprocedure Evaluation (Signed)
Anesthesia Evaluation  Patient identified by MRN, date of birth, ID band Patient awake    Reviewed: Allergy & Precautions, H&P , NPO status , Patient's Chart, lab work & pertinent test results  History of Anesthesia Complications Negative for: history of anesthetic complications  Airway Mallampati: III  TM Distance: >3 FB Neck ROM: full    Dental no notable dental hx. (+) Teeth Intact   Pulmonary Current Smoker,    Pulmonary exam normal breath sounds clear to auscultation       Cardiovascular (-) hypertensionnegative cardio ROS Normal cardiovascular exam Rhythm:regular Rate:Normal     Neuro/Psych negative neurological ROS  negative psych ROS   GI/Hepatic Neg liver ROS, GERD  Controlled,  Endo/Other  negative endocrine ROS  Renal/GU negative Renal ROS  negative genitourinary   Musculoskeletal   Abdominal   Peds  Hematology negative hematology ROS (+)   Anesthesia Other Findings Past Medical History:   Tobacco abuse                                                Marijuana use                                                Reproductive/Obstetrics (+) Pregnancy                             Anesthesia Physical Anesthesia Plan  ASA: II  Anesthesia Plan: Epidural   Post-op Pain Management:    Induction:   Airway Management Planned:   Additional Equipment:   Intra-op Plan:   Post-operative Plan:   Informed Consent: I have reviewed the patients History and Physical, chart, labs and discussed the procedure including the risks, benefits and alternatives for the proposed anesthesia with the patient or authorized representative who has indicated his/her understanding and acceptance.   Dental Advisory Given  Plan Discussed with: Anesthesiologist, CRNA and Surgeon  Anesthesia Plan Comments:         Anesthesia Quick Evaluation

## 2015-07-30 NOTE — Anesthesia Procedure Notes (Signed)
Epidural Patient location during procedure: OB Start time: 07/30/2015 10:48 AM End time: 07/30/2015 10:52 AM  Staffing Anesthesiologist: Margorie John K Performed by: anesthesiologist   Preanesthetic Checklist Completed: patient identified, site marked, surgical consent, pre-op evaluation, timeout performed, IV checked, risks and benefits discussed and monitors and equipment checked  Epidural Patient position: sitting Prep: Betadine Patient monitoring: heart rate, continuous pulse ox and blood pressure Approach: midline Location: L4-L5 Injection technique: LOR saline  Needle:  Needle type: Tuohy  Needle gauge: 18 G Needle length: 9 cm and 9 Needle insertion depth: 7 cm Catheter type: closed end flexible Catheter size: 20 Guage Catheter at skin depth: 11 cm Test dose: negative and 1.5% lidocaine with Epi 1:200 K  Assessment Sensory level: T8 Events: blood not aspirated, injection not painful, no injection resistance, negative IV test and no paresthesia  Additional Notes   Patient tolerated the insertion well without complications.Reason for block:procedure for pain

## 2015-07-30 NOTE — Discharge Summary (Signed)
Physician Obstetric Discharge Summary  Patient ID: Alexis Parsons MRN: [id number] DOB/AGE: [birth year] 24 y.o.   Date of Admission: 07/29/2015  Date of Discharge:   Admitting Diagnosis: Prolonged premature rupture of membranes at [redacted]w[redacted]d  Secondary Diagnosis: INadequate labor. Desires sterilization  Mode of Delivery: normal spontaneous vaginal delivery on 07/30/2015      Discharge Diagnosis: No other diagnosis   Intrapartum Procedures: pitocin augmentation, placement of intrauterine catheter and cytotec induction   Post partum procedures: none  Complications: short cord   Brief Hospital Course  Alexis Parsons is a [id number] who had a SVD on 07/30/2015;  for further details of this delivery, please refer to the delivery note.  Patient had an uncomplicated postpartum course.  By time of discharge on PPD#1, her pain was controlled on oral pain medications; she had appropriate lochia and was ambulating, voiding without difficulty and tolerating regular diet.  She was deemed stable for discharge to home.  Pt desired PP BTL, however Medicaid consent was not signed and it is listed as primary insurance. Pt will plan for interval tubal.   Labs: CBC Latest Ref Rng 07/30/2015 03/28/2015 03/07/2013  WBC 3.6 - 11.0 K/uL 9.5 10.5 12.5(H)  Hemoglobin 12.0 - 16.0 g/dL 11.3(L) 11.7(L) 14.1  Hematocrit 35.0 - 47.0 % 33.1(L) 35.0 40.6  Platelets 150 - 440 K/uL 218 279 209   A NEG / RI/ VI/ GBS negative. Physical exam:  Blood pressure 112/58, pulse 87, temperature 98.1 F (36.7 C), temperature source Oral, resp. rate 16, height  (1.6 m), weight 135 lb (61.236 kg). General: alert and no distress Lochia: appropriate Abdomen: soft, NT Uterine Fundus: firm Extremities: No evidence of DVT seen on physical exam. No lower extremity edema.  Discharge Instructions: Per After Visit Summary. Activity: Advance as tolerated. Pelvic rest for 6 weeks.  Also refer to Discharge Instructions Diet:  Regular Medications:   Medication List    Notice    You have not been prescribed any medications.     Outpatient follow up:  Postpartum contraception: bilateral tubal ligation  Discharged Condition: good  Discharged to: home   Newborn Data: Disposition:home with mother  Apgars: APGAR (1 MIN): 7   APGAR (5 MINS): 9   APGAR (10 MINS):    Baby Feeding: Bottle / Noel Gerold, CNM 07/30/2015 5:35 PM

## 2015-07-30 NOTE — H&P (Addendum)
OB History & Physical   History of Present Illness:  Chief Complaint:  24 year old G3 P1011 with EDC=08/07/2015 presents at 25 6/7 weeks with c/o leaking clear fluid since 2300 9/30. HPI:  Alexis Parsons is a 24 y.o. G24P0011 female at [redacted]w[redacted]d dated by14wk3d ultrasound.  Her pregnancy has been complicated by vaginal bleeding at 21 weeks (received Rhogam), smoking, and use of marijuana in early pregnancy..  She presents to L&D for evaluation of premature rupture of membranes over 24 hours ago. She continues to leak clear fluid, has irregular mild contractions, and denies vaginal bleeding. Prenatal care site: P[facility]atal care at [facility].       Maternal Medical History:   Past Medical History  Diagnosis Date  . Tobacco abuse   . Marijuana use     Past Surgical History  Procedure Laterality Date  . No past surgeries      No Known Allergies  Prior to Admission medications   Not on File          Social History: She  reports that she has been smoking Cigarettes.  She has been smoking about 0.50 packs per day. She has never used smokeless tobacco. She reports that she uses illicit drugs (Marijuana). She reports that she does not drink alcohol.  Family History: family history includes Hyperlipidemia in her father; Hypertension in her paternal grandfather.   Review of Systems: Negative x 10 systems reviewed except as noted in the HPI.    Results for orders placed or performed during the hospital encounter of 07/29/15 (from the past 24 hour(s))  CBC     Status: Abnormal   Collection Time: 07/30/15  1:58 AM  Result Value Ref Range   WBC 9.5 3.6 - 11.0 K/uL   RBC 4.10 3.80 - 5.20 MIL/uL   Hemoglobin 11.3 (L) 12.0 - 16.0 g/dL   HCT 16.1 (L) 09.6 - 04.5 %   MCV 80.6 80.0 - 100.0 fL   MCH 27.6 26.0 - 34.0 pg   MCHC 34.2 32.0 - 36.0 g/dL   RDW 40.9 (H) 81.1 - 91.4 %   Platelets 218 150 - 440 K/uL  Type and screen     Status: None (Preliminary result)   Collection Time:  07/30/15  2:00 AM  Result Value Ref Range   ABO/RH(D) PENDING    Antibody Screen PENDING    Sample Expiration 08/02/2015     Physical Exam:  Vital Signs: BP 125/83 mmHg  Pulse 84  Resp 18  Ht  (1.6 m)  Wt 135 lb (61.236 kg)  BMI 23.92 kg/m2 afebrile General: no acute distress.  HEENT: normocephalic, atraumatic Heart: regular rate & rhythm.  No murmurs Lungs: clear to auscultation bilaterally Abdomen: soft, gravid, non-tender;  EFW: 5#14oz Pelvic:   External: Normal external female genitalia  Cervix: Dilation: 1 / /50%/-2 per RN exam     ROM: + Nitrazine Extremities: non-tender, symmetric, noedema bilaterally.  DTRs: Neurologic: Alert & oriented x 3.    Pertinent Results:  Prenatal Labs: Blood type/Rh A negative  Antibody screen negative  Rubella immune  RPR negative  HBsAg negative  HIV negative  GC negative  Chlamydia negative  Genetic screening Tetra AFP negative  1 hour GTT 125  3 hour GTT N/A  GBS negative on 07/11/2015   Baseline FHR:130s baseline with accelerations to 150s to 160s with no decelerations and moderate variability Toco: contractions occasional, uterine irritablitiy  Assessment:  Alexis Parsons is a 24 y.o. G65P0011 female  at [redacted]w[redacted]d with Prolonged PROM    Plan:  1. Admit to Labor & Delivery   2. CBC-normal WBC 3. T&S, Clrs, IVF 4. GBS negative.   5. Consents obtained. 6. Discussed inducing labor since has been ruptured for over 24 hours. Will use Cytotec orally initially, then Pitocin when becomes more active if needed. Patient agrees with POM 7. Not in pain at this time-Fentanyl for pain and epidural when becoming more active 8. FWB: Cat 1 tracing, afebrile 9. Rh negative: did not receive Rhogam at 28 weeks. Antibody screen here pending. Rhogam post delivery if baby RH positive. 10. UDS 11. Desires pp BTL- has Media planner as well as Medicaid. Did not sign 30 day papers because of difficulty getting medicaid card...will do thru  private insurance Jerae Izard  07/30/2015 2:54 AM

## 2015-07-30 NOTE — Progress Notes (Addendum)
L&D Progress Note   S: Good relief with epidural O: BP 132/82 mmHg  Pulse 77  Temp(Src) 97.4 F (36.3 C) (Oral)  Resp 16  Ht  (1.6 m)  Wt 135 lb (61.236 kg)  BMI 23.92 kg/m2  FHR: 130 with accels to 150s. Had one deceleration to 50s > 60 sec when turned to right  Side. Resolved wih repositioning IUPC: q2-4 min apart of varying strength and duration, IUPC inserted Cervix: 5/80-90%/-1 to 0/ OP Antibody screen returned  Positive for anti-D Upon further questioning patient did remember receiving a second dose of Rhogam in the office after the dose at 21 weeks.   A: Prolonged PROM Single variable deceleration- otherwise Cat 1 strip Anti D antibody screen probably due to second dose of Rhogam.  P: Amnioinfusion if any further decelerations Pitocin augmntation when 30 min without deceleration  Skyra Crichlow, CNM

## 2015-07-31 ENCOUNTER — Encounter
Admission: EM | Disposition: A | Discharge status: Home / Self Care | Payer: Self-pay | Source: Home / Self Care | Attending: Certified Nurse Midwife

## 2015-07-31 LAB — CBC
HCT: 32.9 % — ABNORMAL LOW (ref 35.0–47.0)
Hemoglobin: 11 g/dL — ABNORMAL LOW (ref 12.0–16.0)
MCH: 27.1 pg (ref 26.0–34.0)
MCHC: 33.6 g/dL (ref 32.0–36.0)
MCV: 80.6 fL (ref 80.0–100.0)
PLATELETS: 201 10*3/uL (ref 150–440)
RBC: 4.08 MIL/uL (ref 3.80–5.20)
RDW: 16.5 % — AB (ref 11.5–14.5)
WBC: 10.2 10*3/uL (ref 3.6–11.0)

## 2015-07-31 LAB — RPR: RPR Ser Ql: NONREACTIVE

## 2015-07-31 LAB — FETAL SCREEN: Fetal Screen: NEGATIVE

## 2015-07-31 SURGERY — LIGATION, FALLOPIAN TUBE, POSTPARTUM
Anesthesia: Choice

## 2015-07-31 MED ORDER — PRENATAL MULTIVITAMIN CH
1.0000 | ORAL_TABLET | Freq: Every day | ORAL | Status: DC
  Administered 2015-07-31: 1 via ORAL
  Filled 2015-07-31: qty 1

## 2015-07-31 MED ORDER — LANOLIN HYDROUS EX OINT: TOPICAL_OINTMENT | CUTANEOUS | Status: DC | PRN

## 2015-07-31 MED ORDER — INFLUENZA VAC SPLIT QUAD 0.5 ML IM SUSY: 0.5000 mL | PREFILLED_SYRINGE | INTRAMUSCULAR | Status: DC

## 2015-07-31 MED ORDER — DOCUSATE SODIUM 100 MG PO CAPS
100.0000 mg | ORAL_CAPSULE | Freq: Two times a day (BID) | ORAL | Status: DC
  Administered 2015-07-31: 100 mg via ORAL
  Filled 2015-07-31: qty 1

## 2015-07-31 MED ORDER — SIMETHICONE 80 MG PO CHEW: 80.0000 mg | CHEWABLE_TABLET | ORAL | Status: DC | PRN

## 2015-07-31 MED ORDER — ONDANSETRON HCL 4 MG PO TABS: 4.0000 mg | ORAL_TABLET | ORAL | Status: DC | PRN

## 2015-07-31 MED ORDER — BENZOCAINE-MENTHOL 20-0.5 % EX AERO: 1.0000 "application " | INHALATION_SPRAY | CUTANEOUS | Status: DC | PRN

## 2015-07-31 MED ORDER — INFLUENZA VAC SPLIT QUAD 0.5 ML IM SUSY: 0.5000 mL | PREFILLED_SYRINGE | INTRAMUSCULAR | Status: DC | PRN

## 2015-07-31 MED ORDER — DIBUCAINE 1 % RE OINT: 1.0000 "application " | TOPICAL_OINTMENT | RECTAL | Status: DC | PRN

## 2015-07-31 MED ORDER — WITCH HAZEL-GLYCERIN EX PADS: 1.0000 "application " | MEDICATED_PAD | CUTANEOUS | Status: DC | PRN

## 2015-07-31 MED ORDER — RHO D IMMUNE GLOBULIN 1500 UNIT/2ML IJ SOSY
300.0000 ug | PREFILLED_SYRINGE | Freq: Once | INTRAMUSCULAR | Status: AC
  Administered 2015-07-31: 300 ug via INTRAMUSCULAR
  Filled 2015-07-31: qty 2

## 2015-07-31 MED ORDER — ONDANSETRON HCL 4 MG/2ML IJ SOLN: 4.0000 mg | INTRAMUSCULAR | Status: DC | PRN

## 2015-07-31 MED ORDER — FERROUS SULFATE 325 (65 FE) MG PO TABS
325.0000 mg | ORAL_TABLET | Freq: Every day | ORAL | Status: DC
  Administered 2015-07-31: 325 mg via ORAL
  Filled 2015-07-31: qty 1

## 2015-07-31 MED ORDER — OXYTOCIN 40 UNITS IN LACTATED RINGERS INFUSION - SIMPLE MED
62.5000 mL/h | INTRAVENOUS | Status: DC | PRN
  Administered 2015-07-31: 62.5 mL/h via INTRAVENOUS
  Filled 2015-07-31: qty 1000

## 2015-07-31 NOTE — Progress Notes (Signed)
Pt discharged home with infant.  Discharge instructions and follow up appointment given to and reviewed with pt.  Pt verbalized understanding.  Escorted by auxillary. 

## 2015-07-31 NOTE — Anesthesia Postprocedure Evaluation (Signed)
  Anesthesia Post-op Note  Patient: Alexis Parsons  Procedure(s) Performed: * No procedures listed *  Anesthesia type:No value filed.  Patient location: 346  Post pain: Pain level controlled  Post assessment: Post-op Vital signs reviewed, Patient's Cardiovascular Status Stable, Respiratory Function Stable, Patent Airway and No signs of Nausea or vomiting  Post vital signs: Reviewed and stable  Last Vitals:  Filed Vitals:   07/31/15 0722  BP: 122/84  Pulse: 67  Temp: 36.6 C  Resp: 18    Level of consciousness: awake, alert  and patient cooperative  Complications: No apparent anesthesia complications

## 2015-07-31 NOTE — Progress Notes (Signed)
Subjective:  Doing well minimal lochia, no fevers, no chills.  Objective:   Blood pressure 122/84, pulse 67, temperature 97.9 F (36.6 C), temperature source Oral, resp. rate 18, height  (1.6 m), weight 61.236 kg (135 lb), SpO2 99 %, unknown if currently breastfeeding.  General: NAD Pulmonary: no increased work of breathing Abdomen: non-distended, non-tender, fundus firm at level of umbilicus Extremities: no edema, no erythema, no tenderness  Results for orders placed or performed during the hospital encounter of 07/29/15 (from the past 72 hour(s))  CBC     Status: Abnormal   Collection Time: 07/30/15  1:58 AM  Result Value Ref Range   WBC 9.5 3.6 - 11.0 K/uL   RBC 4.10 3.80 - 5.20 MIL/uL   Hemoglobin 11.3 (L) 12.0 - 16.0 g/dL   HCT 40.9 (L) 81.1 - 91.4 %   MCV 80.6 80.0 - 100.0 fL   MCH 27.6 26.0 - 34.0 pg   MCHC 34.2 32.0 - 36.0 g/dL   RDW 78.2 (H) 95.6 - 21.3 %   Platelets 218 150 - 440 K/uL  Chlamydia/NGC rt PCR ([facility] only)     Status: None   Collection Time: 07/30/15  1:58 AM  Result Value Ref Range   Specimen source GC/Chlam ENDOCERVICAL    Chlamydia Tr NOT DETECTED NOT DETECTED   N gonorrhoeae NOT DETECTED NOT DETECTED    Comment: (NOTE) 100  This methodology has not been evaluated in pregnant women or in 200  patients with a history of hysterectomy. 300 400  This methodology will not be performed on patients less than 65  years of age.   Type and screen     Status: None   Collection Time: 07/30/15  2:00 AM  Result Value Ref Range   ABO/RH(D) A NEG    Antibody Screen POS    Sample Expiration 08/02/2015    Antibody Identification PASSIVELY ACQUIRED ANTI-D   Urine Drug Screen, Qualitative ([facility] only)     Status: None   Collection Time: 07/30/15  3:13 AM  Result Value Ref Range   Tricyclic, Ur Screen NONE DETECTED NONE DETECTED   Amphetamines, Ur Screen NONE DETECTED NONE DETECTED   MDMA (Ecstasy)Ur Screen NONE DETECTED NONE DETECTED   Cocaine  Metabolite,Ur Everetts NONE DETECTED NONE DETECTED   Opiate, Ur Screen NONE DETECTED NONE DETECTED   Phencyclidine (PCP) Ur S NONE DETECTED NONE DETECTED   Cannabinoid 50 Ng, Ur Bradley NONE DETECTED NONE DETECTED   Barbiturates, Ur Screen NONE DETECTED NONE DETECTED   Benzodiazepine, Ur Scrn NONE DETECTED NONE DETECTED   Methadone Scn, Ur NONE DETECTED NONE DETECTED    Comment: (NOTE) 100  Tricyclics, urine               Cutoff 1000 ng/mL 200  Amphetamines, urine             Cutoff 1000 ng/mL 300  MDMA (Ecstasy), urine           Cutoff 500 ng/mL 400  Cocaine Metabolite, urine       Cutoff 300 ng/mL 500  Opiate, urine                   Cutoff 300 ng/mL 600  Phencyclidine (PCP), urine      Cutoff 25 ng/mL 700  Cannabinoid, urine              Cutoff 50 ng/mL 800  Barbiturates, urine             Cutoff  200 ng/mL 900  Benzodiazepine, urine           Cutoff 200 ng/mL 1000 Methadone, urine                Cutoff 300 ng/mL 1100 1200 The urine drug screen provides only a preliminary, unconfirmed 1300 analytical test result and should not be used for non-medical 1400 purposes. Clinical consideration and professional judgment should 1500 be applied to any positive drug screen result due to possible 1600 interfering substances. A more specific alternate chemical method 1700 must be used in order to obtain a confirmed analytical result.  1800 Gas chromato graphy / mass spectrometry (GC/MS) is the preferred 1900 confirmatory method.   CBC     Status: Abnormal   Collection Time: 07/31/15  7:12 AM  Result Value Ref Range   WBC 10.2 3.6 - 11.0 K/uL   RBC 4.08 3.80 - 5.20 MIL/uL   Hemoglobin 11.0 (L) 12.0 - 16.0 g/dL   HCT 91.4 (L) 78.2 - 95.6 %   MCV 80.6 80.0 - 100.0 fL   MCH 27.1 26.0 - 34.0 pg   MCHC 33.6 32.0 - 36.0 g/dL   RDW 21.3 (H) 08.6 - 57.8 %   Platelets 201 150 - 440 K/uL   Information for the patient's newborn:  Mckala, Pantaleon [[id number]]  A POS   Assessment:   24 y.o. [id number]  postpartum day # 1 TSVD  Plan:    1) Acute blood loss anemia - hemodynamically stable and asymptomatic - po ferrous sulfate  2) A neg / RI  - needs rhogam   4) BTL - discussed permanent nature of procedures 1% failure rate, as well as regret in patient under 30.  Patient opts to proceed with BTL.  No 30 day paper will verify can bill through secondary insurance  5) Disposition anticipated discharge PPD#2

## 2015-07-31 NOTE — Discharge Instructions (Signed)
Discharge instructions:  ° °Call office if you have any of the following: headache, visual changes, fever >100 F, chills, breast concerns, excessive vaginal bleeding, incision drainage or problems, leg pain or redness, depression or any other concerns.  ° °Activity: Do not lift > 10 lbs for 6 weeks.  °No intercourse or tampons for 6 weeks.  °No driving for 1-2 weeks.  ° °

## 2015-08-02 LAB — RHOGAM INJECTION: Unit division: 0

## 2015-09-01 ENCOUNTER — Encounter
Admission: RE | Admit: 2015-09-01 | Discharge: 2015-09-01 | Disposition: A | Discharge status: Home / Self Care | Payer: Medicaid Other | Source: Ambulatory Visit | Attending: Obstetrics and Gynecology | Admitting: Obstetrics and Gynecology

## 2015-09-01 DIAGNOSIS — Z01818 Encounter for other preprocedural examination: Secondary | ICD-10-CM | POA: Insufficient documentation

## 2015-09-01 NOTE — Patient Instructions (Signed)
  Your procedure is scheduled on: Thursday Nov. 10, 2016. Report to Same Day Surgery. To find out your arrival time please call [phone] between 1PM - 3PM on Wednesday September 06, 2015.  Remember: Instructions that are not followed completely may result in serious medical risk, up to and including death, or upon the discretion of your surgeon and anesthesiologist your surgery may need to be rescheduled.    _x___ 1. Do not eat food or drink liquids after midnight. No gum chewing or hard candies.     ____ 2. No Alcohol for 24 hours before or after surgery.   ____ 3. Bring all medications with you on the day of surgery if instructed.    __x__ 4. Notify your doctor if there is any change in your medical condition     (cold, fever, infections).     Do not wear jewelry, make-up, hairpins, clips or nail polish.  Do not wear lotions, powders, or perfumes. You may wear deodorant.  Do not shave 48 hours prior to surgery. Men may shave face and neck.  Do not bring valuables to the hospital.    [facility] not [facility] for any belongings or valuables.               Contacts, dentures or bridgework may not be worn into surgery.  Leave your suitcase in the car. After surgery it may be brought to your room.  For patients admitted to the hospital, discharge time is determined by your treatment team.   Patients discharged the day of surgery will not be allowed to drive home.    Please read over the following fact sheets that you were given:   [facility]  ____ Take these medicines the morning of surgery with A SIP OF WATER: None    ____ Fleet Enema (as directed)   _x___ Use CHG Soap as directed  ____ Use inhalers on the day of surgery  ____ Stop metformin 2 days prior to surgery    ____ Take 1/2 of usual insulin dose the night before surgery and none on the morning of  surgery.   ____ Stop Coumadin/Plavix/aspirin on does not apply.  __x__ Stop  Anti-inflammatories such as ibuprofen.  OK to take Tylenol for pain.   ____ Stop supplements until after surgery.    ____ Bring C-Pap to the hospital.

## 2015-09-07 ENCOUNTER — Encounter
Admission: RE | Disposition: A | Discharge status: Home / Self Care | Payer: Self-pay | Source: Ambulatory Visit | Attending: Obstetrics and Gynecology

## 2015-09-07 ENCOUNTER — Ambulatory Visit: Payer: BLUE CROSS/BLUE SHIELD | Admitting: Anesthesiology

## 2015-09-07 ENCOUNTER — Encounter: Payer: Self-pay | Admitting: *Deleted

## 2015-09-07 ENCOUNTER — Ambulatory Visit
Admission: RE | Admit: 2015-09-07 | Discharge: 2015-09-07 | Disposition: A | Discharge status: Home / Self Care | Payer: BLUE CROSS/BLUE SHIELD | Source: Ambulatory Visit | Attending: Obstetrics and Gynecology | Admitting: Obstetrics and Gynecology

## 2015-09-07 DIAGNOSIS — Z8249 Family history of ischemic heart disease and other diseases of the circulatory system: Secondary | ICD-10-CM | POA: Insufficient documentation

## 2015-09-07 DIAGNOSIS — Z302 Encounter for sterilization: Secondary | ICD-10-CM | POA: Insufficient documentation

## 2015-09-07 DIAGNOSIS — Z79899 Other long term (current) drug therapy: Secondary | ICD-10-CM | POA: Diagnosis not present

## 2015-09-07 DIAGNOSIS — F17210 Nicotine dependence, cigarettes, uncomplicated: Secondary | ICD-10-CM | POA: Insufficient documentation

## 2015-09-07 HISTORY — PX: LAPAROSCOPIC TUBAL LIGATION: SHX1937

## 2015-09-07 LAB — POCT PREGNANCY, URINE: Preg Test, Ur: NEGATIVE

## 2015-09-07 SURGERY — LIGATION, FALLOPIAN TUBE, LAPAROSCOPIC
Anesthesia: General

## 2015-09-07 MED ORDER — SUGAMMADEX SODIUM 200 MG/2ML IV SOLN
INTRAVENOUS | Status: DC | PRN
  Administered 2015-09-07: 100 mg via INTRAVENOUS

## 2015-09-07 MED ORDER — LABETALOL HCL 5 MG/ML IV SOLN
5.0000 mg | Freq: Once | INTRAVENOUS | Status: AC
  Administered 2015-09-07: 5 mg via INTRAVENOUS

## 2015-09-07 MED ORDER — IBUPROFEN 600 MG PO TABS: 600.0000 mg | ORAL_TABLET | Freq: Four times a day (QID) | ORAL | Status: DC | PRN

## 2015-09-07 MED ORDER — OXYCODONE-ACETAMINOPHEN 5-325 MG PO TABS: 1.0000 | ORAL_TABLET | Freq: Four times a day (QID) | ORAL | Status: DC | PRN

## 2015-09-07 MED ORDER — LACTATED RINGERS IV SOLN
INTRAVENOUS | Status: DC
  Administered 2015-09-07: 15:00:00 via INTRAVENOUS

## 2015-09-07 MED ORDER — FAMOTIDINE 20 MG PO TABS
20.0000 mg | ORAL_TABLET | Freq: Once | ORAL | Status: AC
  Administered 2015-09-07: 20 mg via ORAL

## 2015-09-07 MED ORDER — FENTANYL CITRATE (PF) 100 MCG/2ML IJ SOLN
INTRAMUSCULAR | Status: DC | PRN
  Administered 2015-09-07: 50 ug via INTRAVENOUS
  Administered 2015-09-07: 150 ug via INTRAVENOUS
  Administered 2015-09-07: 50 ug via INTRAVENOUS

## 2015-09-07 MED ORDER — DEXAMETHASONE SODIUM PHOSPHATE 4 MG/ML IJ SOLN
INTRAMUSCULAR | Status: DC | PRN
  Administered 2015-09-07: 5 mg via INTRAVENOUS

## 2015-09-07 MED ORDER — ROCURONIUM BROMIDE 100 MG/10ML IV SOLN
INTRAVENOUS | Status: DC | PRN
  Administered 2015-09-07: 30 mg via INTRAVENOUS

## 2015-09-07 MED ORDER — PROPOFOL 10 MG/ML IV BOLUS
INTRAVENOUS | Status: DC | PRN
  Administered 2015-09-07: 180 mg via INTRAVENOUS

## 2015-09-07 MED ORDER — LIDOCAINE HCL (CARDIAC) 20 MG/ML IV SOLN
INTRAVENOUS | Status: DC | PRN
  Administered 2015-09-07: 50 mg via INTRAVENOUS

## 2015-09-07 MED ORDER — FENTANYL CITRATE (PF) 100 MCG/2ML IJ SOLN
INTRAMUSCULAR | Status: AC
  Filled 2015-09-07: qty 2

## 2015-09-07 MED ORDER — ONDANSETRON HCL 4 MG/2ML IJ SOLN
INTRAMUSCULAR | Status: DC | PRN
  Administered 2015-09-07: 4 mg via INTRAVENOUS

## 2015-09-07 MED ORDER — LABETALOL HCL 5 MG/ML IV SOLN
INTRAVENOUS | Status: AC
  Filled 2015-09-07: qty 4

## 2015-09-07 MED ORDER — HYDRALAZINE HCL 20 MG/ML IJ SOLN
INTRAMUSCULAR | Status: AC
  Filled 2015-09-07: qty 1

## 2015-09-07 MED ORDER — KETOROLAC TROMETHAMINE 30 MG/ML IJ SOLN
INTRAMUSCULAR | Status: DC | PRN
  Administered 2015-09-07: 30 mg via INTRAVENOUS

## 2015-09-07 MED ORDER — HYDRALAZINE HCL 20 MG/ML IJ SOLN
10.0000 mg | Freq: Once | INTRAMUSCULAR | Status: AC
  Administered 2015-09-07: 10 mg via INTRAVENOUS

## 2015-09-07 MED ORDER — MIDAZOLAM HCL 2 MG/2ML IJ SOLN
INTRAMUSCULAR | Status: DC | PRN
  Administered 2015-09-07: 2 mg via INTRAVENOUS

## 2015-09-07 MED ORDER — ONDANSETRON HCL 4 MG/2ML IJ SOLN: 4.0000 mg | Freq: Once | INTRAMUSCULAR | Status: DC | PRN

## 2015-09-07 MED ORDER — FENTANYL CITRATE (PF) 100 MCG/2ML IJ SOLN
25.0000 ug | INTRAMUSCULAR | Status: DC | PRN
  Administered 2015-09-07 (×2): 25 ug via INTRAVENOUS

## 2015-09-07 SURGICAL SUPPLY — 19 items
BLADE SURG SZ11 CARB STEEL (BLADE) ×3 IMPLANT
CATH ROBINSON RED A/P 16FR (CATHETERS) ×3 IMPLANT
CHLORAPREP W/TINT 26ML (MISCELLANEOUS) ×3 IMPLANT
DRSG TEGADERM 2-3/8X2-3/4 SM (GAUZE/BANDAGES/DRESSINGS) ×3 IMPLANT
GLOVE BIO SURGEON STRL SZ7 (GLOVE) ×3 IMPLANT
GLOVE INDICATOR 7.5 STRL GRN (GLOVE) ×3 IMPLANT
GOWN STRL REUS W/ TWL LRG LVL3 (GOWN DISPOSABLE) ×2 IMPLANT
GOWN STRL REUS W/TWL LRG LVL3 (GOWN DISPOSABLE) ×4
KIT DISPOSABLE FALLOPE RING (Ring) ×3 IMPLANT
KIT RM TURNOVER CYSTO AR (KITS) ×3 IMPLANT
LABEL OR SOLS (LABEL) ×3 IMPLANT
LIQUID BAND (GAUZE/BANDAGES/DRESSINGS) ×3 IMPLANT
NS IRRIG 500ML POUR BTL (IV SOLUTION) ×3 IMPLANT
PACK GYN LAPAROSCOPIC (MISCELLANEOUS) ×3 IMPLANT
PAD OB MATERNITY 4.3X12.25 (PERSONAL CARE ITEMS) ×3 IMPLANT
PAD PREP 24X41 OB/GYN DISP (PERSONAL CARE ITEMS) ×3 IMPLANT
SUT MNCRL AB 4-0 PS2 18 (SUTURE) ×3 IMPLANT
TROCAR XCEL NON-BLD 5MMX100MML (ENDOMECHANICALS) ×3 IMPLANT
TUBING INSUFFLATOR HI FLOW (MISCELLANEOUS) ×3 IMPLANT

## 2015-09-07 NOTE — Transfer of Care (Signed)
Immediate Anesthesia Transfer of Care Note  Patient: Alexis FernsChloe M Mersereau  Procedure(s) Performed: Procedure(s): LAPAROSCOPIC TUBAL LIGATION (N/A)  Patient Location: PACU  Anesthesia Type:General  Level of Consciousness: awake  Airway & Oxygen Therapy: Patient Spontanous Breathing and Patient connected to face mask oxygen  Post-op Assessment: Report given to RN  Post vital signs: Reviewed  Last Vitals:  Filed Vitals:   09/07/15 1859  BP: 124/93  Pulse: 89  Temp: 36.6 C  Resp: 12    Complications: No apparent anesthesia complications

## 2015-09-07 NOTE — Anesthesia Procedure Notes (Signed)
Procedure Name: Intubation Performed by: Loral Campi Pre-anesthesia Checklist: Patient identified, Patient being monitored, Timeout performed, Emergency Drugs available and Suction available Patient Re-evaluated:Patient Re-evaluated prior to inductionOxygen Delivery Method: Circle system utilized Preoxygenation: Pre-oxygenation with 100% oxygen Intubation Type: IV induction Ventilation: Mask ventilation without difficulty Laryngoscope Size: Miller and 2 Grade View: Grade I Tube type: Oral Tube size: 7.0 mm Number of attempts: 1 Placement Confirmation: ETT inserted through vocal cords under direct vision,  positive ETCO2 and breath sounds checked- equal and bilateral Secured at: 21 cm Tube secured with: Tape Dental Injury: Teeth and Oropharynx as per pre-operative assessment        

## 2015-09-07 NOTE — Discharge Instructions (Signed)

## 2015-09-07 NOTE — Anesthesia Preprocedure Evaluation (Signed)
Anesthesia Evaluation  Patient identified by MRN, date of birth, ID band Patient awake    Reviewed: Allergy & Precautions, NPO status , Patient's Chart, lab work & pertinent test results, reviewed documented beta blocker date and time   Airway Mallampati: II  TM Distance: >3 FB     Dental  (+) Chipped   Pulmonary Current Smoker,          Cardiovascular     Neuro/Psych    GI/Hepatic   Endo/Other    Renal/GU      Musculoskeletal   Abdominal   Peds  Hematology   Anesthesia Other Findings   Reproductive/Obstetrics                             Anesthesia Physical Anesthesia Plan  ASA: II  Anesthesia Plan: General   Post-op Pain Management:    Induction: Intravenous  Airway Management Planned: Oral ETT  Additional Equipment:   Intra-op Plan:   Post-operative Plan:   Informed Consent: I have reviewed the patients History and Physical, chart, labs and discussed the procedure including the risks, benefits and alternatives for the proposed anesthesia with the patient or authorized representative who has indicated his/her understanding and acceptance.     Plan Discussed with: CRNA  Anesthesia Plan Comments:         Anesthesia Quick Evaluation  

## 2015-09-07 NOTE — Anesthesia Postprocedure Evaluation (Signed)
  Anesthesia Post-op Note  Patient: Alexis Parsons  Procedure(s) Performed: Procedure(s): LAPAROSCOPIC TUBAL LIGATION (N/A)  Anesthesia type:General  Patient location: PACU  Post pain: Pain level controlled  Post assessment: Post-op Vital signs reviewed, Patient's Cardiovascular Status Stable, Respiratory Function Stable, Patent Airway and No signs of Nausea or vomiting  Post vital signs: Reviewed and stable  Last Vitals:  Filed Vitals:   09/07/15 2033  BP:   Pulse:   Temp: 36.9 C  Resp:     Level of consciousness: awake, alert  and patient cooperative  Complications: No apparent anesthesia complications

## 2015-09-07 NOTE — H&P (Signed)
Initial paper  H&P on chart:   History reviewed, patient examined, no change in status, stable for surgery.  

## 2015-09-07 NOTE — Op Note (Signed)
Preoperative Diagnosis: 1) 24 y.o.  [id number] desiring permanent surgical sterilization  Postoperative Diagnosis: 1) 24 y.o.  [id number] desiring permanent surgical sterilization  Operation Performed: Laparoscopic bilateral tubal ligation via falope ring application  Indication: 24 y.o. [id number]  with undesired fertility, desires permanent sterilization.  Other reversible forms of contraception were discussed with patient; she declines all other modalities. Permanent nature of as well as associated risks of the procedure discussed with patient including but not limited to: risk of regret, permanence of method, bleeding, infection, injury to surrounding organs and need for additional procedures.  Failure risk of 0.5-1% with increased risk of ectopic gestation if pregnancy occurs was also discussed with patient.    Anesthesia: General  Preoperative Antibiotics: none  Estimated Blood Loss: 5mL  IV Fluids: 600mL  Urine Output:: 20mL  Drains or Tubes: none  Implants: none  Specimens Removed: none  Complications: none  Intraoperative Findings: Normal tubes, ovaries, and uterus.  Good 1cm knuckle of tube applied within each falope ring.  Patient Condition: stable  Procedure in Detail:  Patient was taken to the operating room where she was administered general anesthesia.  She was positioned in the dorsal lithotomy position utilizing Allen stirups, prepped and draped in the usual sterile fashion.  Prior to proceeding with procedure a time out was performed.  Attention was turned to the patient's pelvis.  A red rubber catheter was used to empty the patient's bladder.  An operative speculum was placed to allow visualization of the cervix.  The anterior lip of the cervix was grasped with a single tooth tenaculum, and a Hulka tenaculum was placed to allow manipulation of the uterus.  The operative speculum and single tooth tenaculum were then removed.  Attention was turned to the patient's abdomen.   The umbilicus was infiltrated with 1% Sensorcaine, before making a stab incision using an 11 blade scalpel.  A 5mm Excel trocar was then used to gain direct entry into the peritoneal cavity utilizing the camera to visualize progress of the trocar during placement.  Once peritoneal entry had been achieved, insufflation was started and pneumoperitoneum established at a pressure of 15mmHg.   General inspection of the abdomen revealed the above noted findings.  A spot 2cm midline above the pubic symphysis was injected with 1% Sensorcaine and a stab incision was made using an 11 blade scalpel.  The 8mm falope ring trocar was place suprapubic through this incision under direct visualization.  The right tube was identified and grasped in a mid-isthmic portion using the falope ring application.  The falope ring was applied with a good 1cm knuckle of blanching tube noted within the falope ring after application.  The left tube was identified and a second falope ring was applied in a similar fashion.  Pneumoperitoneum was evacuated.  The trocars were removed.  The 8mm trocar site was closed with 4-0 Monocryl in a subcuticular fashion.  All trocar sites were then dressed with surgical skin glue.  The Hulka tenaculum was removed.  Sponge needle and instrument counts were correct time two.  The patient tolerated the procedure well and was taken to the recovery room in stable condition.

## 2015-09-07 NOTE — Progress Notes (Signed)
BP 160/100. NAD.  Pt rates pain 2/10.  Dr. Maisie Fushomas evaluated pt face to face. Pt is trying to void using bedside commode, although she states she doesn't feel her bladder is full.

## 2015-09-08 ENCOUNTER — Encounter: Payer: Self-pay | Admitting: Obstetrics and Gynecology

## 2018-11-23 ENCOUNTER — Ambulatory Visit (INDEPENDENT_AMBULATORY_CARE_PROVIDER_SITE_OTHER): Payer: Managed Care, Other (non HMO) | Admitting: Obstetrics & Gynecology

## 2018-11-23 ENCOUNTER — Encounter: Payer: Self-pay | Admitting: Obstetrics & Gynecology

## 2018-11-23 ENCOUNTER — Other Ambulatory Visit (HOSPITAL_COMMUNITY)
Admission: RE | Admit: 2018-11-23 | Discharge: 2018-11-23 | Disposition: A | Discharge status: Home / Self Care | Payer: Managed Care, Other (non HMO) | Source: Ambulatory Visit | Attending: Obstetrics & Gynecology | Admitting: Obstetrics & Gynecology

## 2018-11-23 VITALS — BP 120/80 | Ht 62.0 in | Wt 108.0 lb

## 2018-11-23 DIAGNOSIS — Z124 Encounter for screening for malignant neoplasm of cervix: Secondary | ICD-10-CM | POA: Insufficient documentation

## 2018-11-23 DIAGNOSIS — R8290 Unspecified abnormal findings in urine: Secondary | ICD-10-CM | POA: Diagnosis not present

## 2018-11-23 DIAGNOSIS — R319 Hematuria, unspecified: Secondary | ICD-10-CM

## 2018-11-23 DIAGNOSIS — Z01419 Encounter for gynecological examination (general) (routine) without abnormal findings: Secondary | ICD-10-CM | POA: Diagnosis not present

## 2018-11-23 DIAGNOSIS — Z202 Contact with and (suspected) exposure to infections with a predominantly sexual mode of transmission: Secondary | ICD-10-CM

## 2018-11-23 LAB — POCT URINALYSIS DIPSTICK
Bilirubin, UA: NEGATIVE
Blood, UA: POSITIVE
GLUCOSE UA: NEGATIVE
Ketones, UA: NEGATIVE
LEUKOCYTES UA: NEGATIVE
Nitrite, UA: NEGATIVE
Protein, UA: NEGATIVE
Spec Grav, UA: 1.01 (ref 1.010–1.025)
Urobilinogen, UA: 0.2 E.U./dL
pH, UA: 5 (ref 5.0–8.0)

## 2018-11-23 NOTE — Patient Instructions (Signed)
Metrorrhagia Metrorrhagia is bleeding from the womb (uterus) that is not normal. The bleeding usually happens between periods. It happens often. Follow these instructions at home: Watch your symptoms for any changes. Tell your doctor about them. Follow these instructions to help with your condition: Eating and drinking   Eat meals that have a lot of the nutrients that your body needs (are well-balanced).  Eat foods that are high in iron. Some foods with iron are: ? Liver. ? Meat. ? Shellfish. ? Green leafy vegetables. ? Eggs.  If you have trouble pooping (constipation): ? Drink plenty of water. Drink enough to keep your pee (urine) pale yellow. ? Take over-the-counter or prescription medicines. ? Eat foods that are high in fiber. These include beans, whole grains, and fresh fruits and vegetables. ? Limit foods that are high in fat and sugar. These include fried or sweet foods. Medicines  Take over-the-counter and prescription medicines only as told by your doctor.  Do not change medicines without talking with your doctor.  Do not take aspirin or medicines that have aspirin: ? During your period. ? During the week before your period.  If you were prescribed iron pills, take them as told by your doctor. Activity  If you need to change your pad or tampon more than one time in 2 hours: ? Lie in bed with your feet raised (elevated). ? Put a cold pack on your lower belly (abdomen). ? Rest as much as you can until the bleeding stops or slows down. General instructions   For 2 months, write down: ? When your period starts. ? When your period ends. ? When you have bleeding that happens outside your period. ? What problems you notice.  Keep all follow-up visits as told by your doctor. This is important. Contact a doctor if:  You feel light-headed.  You feel weak.  You feel sick to your stomach (nauseous).  You throw up (vomit).  You cannot eat or drink without throwing  up.  You feel dizzy while using medicine.  You have watery poop (diarrhea) while using medicine.  You have questions about birth control. Get help right away if:  You have a fever.  You have chills.  You need to change your pad or tampon more than one time in an hour.  You have more bleeding than before.  Your blood has clumps (clots) in it.  You have pain in your belly.  You pass out (lose consciousness).  You have a rash. Summary  Metrorrhagia is bleeding from the womb (uterus) that happens between periods. It happens often.  Watch your symptoms for any changes. Tell your doctor about them.  Eat meals that have a lot of nutrients. Make sure you eat foods that are high in iron. These include liver, meat, shellfish, green vegetables, and eggs.  Get help right away if you have a fever or a rash, you see clots in your blood, or you have more bleeding than before. This information is not intended to replace advice given to you by your health care provider. Make sure you discuss any questions you have with your health care provider. Document Released: 01/06/2012 Document Revised: 04/16/2018 Document Reviewed: 04/16/2018 Elsevier Interactive Patient Education  2019 [employer]  

## 2018-11-23 NOTE — Progress Notes (Signed)
HPI:      [name] is a 28 y.o. [phone] who LMP was Patient's last menstrual period was 10/31/2018., she presents today for her annual examination. The patient has no complaints today.  Periods are regular, she does skip days and restart at times, it is not bothersome to her. The patient is sexually active. Her last pap: was normal. The patient does perform self breast exams.  There is no notable family history of breast or ovarian cancer in her family.  The patient has regular exercise: yes.  The patient denies current symptoms of depression.    GYN History: Contraception: tubal ligation  PMHx: Past Medical History:  Diagnosis Date  . Marijuana use   . Thyroid disease   . Tobacco abuse    Past Surgical History:  Procedure Laterality Date  . LAPAROSCOPIC TUBAL LIGATION N/A 09/07/2015   Procedure: LAPAROSCOPIC TUBAL LIGATION;  Surgeon: Vena Austria, MD;  Location: [facility] ORS;  Service: Gynecology;  Laterality: N/A;  . NO PAST SURGERIES    . WISDOM TOOTH EXTRACTION     Family History  Problem Relation Age of Onset  . Hyperlipidemia Father   . Hypertension Paternal Grandfather    Social History   Tobacco Use  . Smoking status: Current Every Day Smoker    Packs/day: 0.50    Types: Cigarettes  . Smokeless tobacco: Never Used  Substance Use Topics  . Alcohol use: No  . Drug use: Yes    Types: Marijuana    Comment: + UDS 01/30/15, last used pot 1.5 years ago   No current outpatient medications on file. Allergies: Patient has no known allergies.  Review of Systems  Constitutional: Negative for chills, fever and malaise/fatigue.  HENT: Negative for congestion, sinus pain and sore throat.   Eyes: Negative for blurred vision and pain.  Respiratory: Negative for cough and wheezing.   Cardiovascular: Negative for chest pain and leg swelling.  Gastrointestinal: Negative for abdominal pain, constipation, diarrhea, heartburn, nausea and vomiting.  Genitourinary: Negative  for dysuria, frequency, hematuria and urgency.  Musculoskeletal: Negative for back pain, joint pain, myalgias and neck pain.  Skin: Negative for itching and rash.  Neurological: Negative for dizziness, tremors and weakness.  Endo/Heme/Allergies: Does not bruise/bleed easily.  Psychiatric/Behavioral: Negative for depression. The patient is not nervous/anxious and does not have insomnia.    Objective: BP 120/80   Ht 5\' 2"  (1.575 m)   Wt 108 lb (49 kg)   LMP 10/31/2018   BMI 19.75 kg/m   Filed Weights   11/23/18 1029  Weight: 108 lb (49 kg)   Body mass index is 19.75 kg/m. Physical Exam Constitutional:      General: She is not in acute distress.    Appearance: She is well-developed.  Genitourinary:     Pelvic exam was performed with patient supine.     Vagina, uterus and rectum normal.     No lesions in the vagina.     No vaginal bleeding.     No cervical motion tenderness, friability, lesion or polyp.     Uterus is mobile.     Uterus is not enlarged.     No uterine mass detected.    Uterus is midaxial.     No right or left adnexal mass present.     Right adnexa not tender.     Left adnexa not tender.  HENT:     Head: Normocephalic and atraumatic. No laceration.     Right Ear: Hearing  normal.     Left Ear: Hearing normal.     Mouth/Throat:     Pharynx: Uvula midline.  Eyes:     Pupils: Pupils are equal, round, and reactive to light.  Neck:     Musculoskeletal: Normal range of motion and neck supple.     Thyroid: No thyromegaly.  Cardiovascular:     Rate and Rhythm: Normal rate and regular rhythm.     Heart sounds: No murmur. No friction rub. No gallop.   Pulmonary:     Effort: Pulmonary effort is normal. No respiratory distress.     Breath sounds: Normal breath sounds. No wheezing.  Chest:     Breasts:        Right: No mass, skin change or tenderness.        Left: No mass, skin change or tenderness.  Abdominal:     General: Bowel sounds are normal. There is no  distension.     Palpations: Abdomen is soft.     Tenderness: There is no abdominal tenderness. There is no rebound.  Musculoskeletal: Normal range of motion.  Neurological:     Mental Status: She is alert and oriented to person, place, and time.     Cranial Nerves: No cranial nerve deficit.  Skin:    General: Skin is warm and dry.  Psychiatric:        Judgment: Judgment normal.  Vitals signs reviewed.   Assessment:  ANNUAL EXAM 1. Women's annual routine gynecological examination   2. Screening for cervical cancer   3. STD exposure   4. Abnormal urine odor    Screening Plan:            1.  Cervical Screening-  Pap smear done today  2. Breast screening- Exam annually and mammogram>40 planned   3. Colonoscopy every 10 years, Hemoccult testing - after age 54  4. Labs Ordered today  5. Cou35nseling for contraception: bilateral tubal ligation  Other:  1. Women's annual routine gynecological examination No other concerns  2. Screening for cervical cancer - Cytology - PAP  3. STD exposure/pt prefers to be safe and sure - STD Panel - HIV antibody (with reflex)  4. Abnormal urine odor UA today- hematuria U Culture sent     F/U  Return in about 1 year (around 11/24/2019) for Annual.  Annamarie MajorPaul Casha Estupinan, MD, Merlinda FrederickFACOG [facility], [facility] 11/23/2018  10:56 AM

## 2018-11-24 LAB — CYTOLOGY - PAP
CHLAMYDIA, DNA PROBE: NEGATIVE
DIAGNOSIS: NEGATIVE
Neisseria Gonorrhea: NEGATIVE
Trichomonas: NEGATIVE

## 2018-11-25 LAB — RPR+HSVIGM+HBSAG+HSV2(IGG)+...
HEP B S AG: NEGATIVE
HIV Screen 4th Generation wRfx: NONREACTIVE
HSV 2 IGG, TYPE SPEC: 6.29 {index} — AB (ref 0.00–0.90)
HSVI/II Comb IgM: <0.91 Ratio (ref 0.00–0.90)
RPR Ser Ql: NONREACTIVE

## 2018-12-16 ENCOUNTER — Encounter: Payer: Self-pay | Admitting: Obstetrics & Gynecology

## 2018-12-16 ENCOUNTER — Ambulatory Visit (INDEPENDENT_AMBULATORY_CARE_PROVIDER_SITE_OTHER): Payer: Managed Care, Other (non HMO) | Admitting: Obstetrics & Gynecology

## 2018-12-16 VITALS — BP 100/60 | Ht 63.0 in | Wt 108.0 lb

## 2018-12-16 DIAGNOSIS — R8290 Unspecified abnormal findings in urine: Secondary | ICD-10-CM | POA: Diagnosis not present

## 2018-12-16 DIAGNOSIS — R319 Hematuria, unspecified: Secondary | ICD-10-CM | POA: Diagnosis not present

## 2018-12-16 LAB — POCT URINALYSIS DIPSTICK
BILIRUBIN UA: NEGATIVE
Blood, UA: POSITIVE
GLUCOSE UA: NEGATIVE
KETONES UA: NEGATIVE
Leukocytes, UA: NEGATIVE
Nitrite, UA: NEGATIVE
Protein, UA: POSITIVE — AB
Spec Grav, UA: 1.01 (ref 1.010–1.025)
Urobilinogen, UA: 0.2 E.U./dL
pH, UA: 5 (ref 5.0–8.0)

## 2018-12-16 NOTE — Progress Notes (Signed)
  HPI:      [name] is a 28 y.o. [phone] who LMP was Patient's last menstrual period was 11/23/2018., presents today for a problem visit.    Urinary Tract Infection: Patient complains of abnormal smelling urine . She has had symptoms for 1 week. Patient also complains of no burning or urgency; does feel she goes more frequently.  Was seen 1/27 and today w UA hematuria only each visit.. Patient denies back pain, fever and vaginal discharge. Patient does not have a history of recurrent UTI.  Patient does not have a history of pyelonephritis.   PMHx: She  has a past medical history of Marijuana use, Thyroid disease, and Tobacco abuse. Also,  has a past surgical history that includes No past surgeries; Wisdom tooth extraction; and Laparoscopic tubal ligation (N/A, 09/07/2015)., family history includes Hyperlipidemia in her father; Hypertension in her paternal grandfather.,  reports that she has been smoking cigarettes. She has been smoking about 0.50 packs per day. She has never used smokeless tobacco. She reports current drug use. Drug: Marijuana. She reports that she does not drink alcohol.  She currently has no medications in their medication list. Also, has No Known Allergies.  Review of Systems  Constitutional: Negative for chills, fever and malaise/fatigue.  HENT: Negative for congestion, sinus pain and sore throat.   Eyes: Negative for blurred vision and pain.  Respiratory: Negative for cough and wheezing.   Cardiovascular: Negative for chest pain and leg swelling.  Gastrointestinal: Negative for abdominal pain, constipation, diarrhea, heartburn, nausea and vomiting.  Genitourinary: Negative for dysuria, frequency, hematuria and urgency.  Musculoskeletal: Negative for back pain, joint pain, myalgias and neck pain.  Skin: Negative for itching and rash.  Neurological: Negative for dizziness, tremors and weakness.  Endo/Heme/Allergies: Does not bruise/bleed easily.    Psychiatric/Behavioral: Negative for depression. The patient is not nervous/anxious and does not have insomnia.     Objective: BP 100/60   Ht 5\' 3"  (1.6 m)   Wt 108 lb (49 kg)   LMP 11/23/2018   BMI 19.13 kg/m  Physical Exam Constitutional:      General: She is not in acute distress.    Appearance: She is well-developed.  Abdominal:     General: Abdomen is flat.     Palpations: Abdomen is soft.     Tenderness: There is no abdominal tenderness.     Comments: Back: No CVAT  Musculoskeletal: Normal range of motion.  Neurological:     Mental Status: She is alert and oriented to person, place, and time.  Skin:    General: Skin is warm and dry.  Vitals signs reviewed.   ASSESSMENT/PLAN:   Hematuria   Abnormal urine odor    -  Primary   Relevant Orders   POCT urinalysis dipstick (Completed)   Ambulatory referral to Urology   Hematuria, unspecified type       Relevant Orders   Ambulatory referral to Urology    No sign of UTI.  Not on period now to contribute to hematuria. Concern for repetitive sx's.  Annamarie Major, MD, [facility], [facility] 12/16/2018  4:11 PM

## 2019-01-18 ENCOUNTER — Ambulatory Visit: Payer: Self-pay | Admitting: Urology

## 2019-03-01 ENCOUNTER — Ambulatory Visit: Payer: Self-pay | Admitting: Urology

## 2019-04-20 ENCOUNTER — Ambulatory Visit: Payer: Self-pay | Admitting: Urology

## 2019-05-20 ENCOUNTER — Encounter: Payer: Self-pay | Admitting: Obstetrics & Gynecology

## 2019-05-20 NOTE — Telephone Encounter (Signed)
Needs appt to assess.

## 2019-05-20 NOTE — Telephone Encounter (Signed)
Please schedule

## 2019-05-20 NOTE — Telephone Encounter (Signed)
Patient is schedule for 05/28/19 with RPH °

## 2019-05-21 ENCOUNTER — Ambulatory Visit: Payer: Managed Care, Other (non HMO) | Admitting: Obstetrics & Gynecology

## 2019-05-28 ENCOUNTER — Other Ambulatory Visit: Payer: Self-pay

## 2019-05-28 ENCOUNTER — Ambulatory Visit (INDEPENDENT_AMBULATORY_CARE_PROVIDER_SITE_OTHER): Payer: Managed Care, Other (non HMO) | Admitting: Obstetrics & Gynecology

## 2019-05-28 ENCOUNTER — Encounter: Payer: Self-pay | Admitting: Obstetrics & Gynecology

## 2019-05-28 VITALS — BP 102/60 | Wt 108.0 lb

## 2019-05-28 DIAGNOSIS — B029 Zoster without complications: Secondary | ICD-10-CM | POA: Diagnosis not present

## 2019-05-28 MED ORDER — VALACYCLOVIR HCL 500 MG PO TABS: 500.0000 mg | ORAL_TABLET | Freq: Every day | ORAL | 11 refills | Status: AC

## 2019-05-28 NOTE — Progress Notes (Signed)
  HPI:      [name] is a 28 y.o. [phone], Patient's last menstrual period was 05/04/2019 (approximate)., presents today for a problem visit.  She complains of:  Skin concern:   This is a 28 y.o. old Caucasian/White female who presents for the evaluation of inner thigh skin lesion(s). She describes the lesion(s) as elevated, confluence of multiple vesicles, and painful. She indicates that she has noticed 1 area of lesions and they are in the same locale as last instance 4 years ago when pregnant. She indicates she first noticed the problem one week ago. She admits to symptoms of pain.  The following aggravating factors are identified: none. The following alleviating factors are identified: none.  No prior biopsy or treatment.  PMHx: She  has a past medical history of Marijuana use, Thyroid disease, and Tobacco abuse. Also,  has a past surgical history that includes No past surgeries; Wisdom tooth extraction; Laparoscopic tubal ligation (N/A, 09/07/2015); and Tubal ligation., family history includes Hyperlipidemia in her father; Hypertension in her paternal grandfather.,  reports that she has been smoking cigarettes. She has been smoking about 0.50 packs per day. She has never used smokeless tobacco. She reports current drug use. Drug: Marijuana. She reports that she does not drink alcohol.  She has a current medication list which includes the following prescription(s): levothyroxine and valacyclovir. Also, has No Known Allergies.  Review of Systems  All other systems reviewed and are negative.  Objective: BP 102/60   Wt 108 lb (49 kg)   LMP 05/04/2019 (Approximate)   BMI 19.13 kg/m  Physical Exam Constitutional:      General: She is not in acute distress.    Appearance: She is well-developed.  Musculoskeletal: Normal range of motion.  Neurological:     Mental Status: She is alert and oriented to person, place, and time.  Skin:    General: Skin is warm and dry.     Findings:  Lesion and rash present. Rash is vesicular.          Comments: Rash on right inner thigh  Vitals signs reviewed.     ASSESSMENT/PLAN:     Herpes zoster without complication    -  Primary   Relevant Medications   valACYclovir (VALTREX) 500 MG tablet    Discussed tx dose and preventative dose for Valtrex. She desires to take preventative dose Derm referral if difficult to manage  Barnett Applebaum, MD, [facility], [facility] 05/28/2019  4:32 PM

## 2019-06-07 ENCOUNTER — Encounter

## 2019-06-07 ENCOUNTER — Ambulatory Visit (INDEPENDENT_AMBULATORY_CARE_PROVIDER_SITE_OTHER): Payer: Managed Care, Other (non HMO) | Admitting: Urology

## 2019-06-07 ENCOUNTER — Encounter: Payer: Self-pay | Admitting: Urology

## 2019-06-07 ENCOUNTER — Other Ambulatory Visit: Payer: Self-pay

## 2019-06-07 VITALS — BP 120/75 | HR 80 | Ht 62.0 in | Wt 105.0 lb

## 2019-06-07 DIAGNOSIS — R319 Hematuria, unspecified: Secondary | ICD-10-CM

## 2019-06-07 LAB — URINALYSIS, COMPLETE
Bilirubin, UA: NEGATIVE
Glucose, UA: NEGATIVE
Ketones, UA: NEGATIVE
Leukocytes,UA: NEGATIVE
Nitrite, UA: NEGATIVE
Protein,UA: NEGATIVE
RBC, UA: NEGATIVE
Specific Gravity, UA: 1.02 (ref 1.005–1.030)
Urobilinogen, Ur: 1 mg/dL (ref 0.2–1.0)
pH, UA: 8.5 — ABNORMAL HIGH (ref 5.0–7.5)

## 2019-06-07 LAB — MICROSCOPIC EXAMINATION
Epithelial Cells (non renal): >10 /hpf — AB (ref 0–10)
RBC: NONE SEEN /hpf (ref 0–2)

## 2019-06-07 NOTE — Progress Notes (Signed)
06/07/2019 2:08 PM   Lakitha Koren Bound Nov 17, 1990 [id number]  Referring provider: Gae Dry, MD [address] [place],  [place] [address]  Chief Complaint  Patient presents with  . Hematuria    HPI: Alexis Parsons is a 28 y.o. female seen at request of Dr. Kenton Kingfisher for evaluation of abnormal urine odor and hematuria.  She complained of abnormal urine odor and late January and mid February.  She had 2 UAs which were dip positive urine however microscopy was not performed.  No other abnormalities were noted.  She had no voiding symptoms.  She stated she did not know why she was here today.  Denies flank, abdominal or pelvic pain.  She denies gross hematuria.  She states no recent changes in urine odor  PMH: Past Medical History:  Diagnosis Date  . Marijuana use   . Thyroid disease   . Tobacco abuse     Surgical History: Past Surgical History:  Procedure Laterality Date  . LAPAROSCOPIC TUBAL LIGATION N/A 09/07/2015   Procedure: LAPAROSCOPIC TUBAL LIGATION;  Surgeon: Malachy Mood, MD;  Location: [facility] ORS;  Service: Gynecology;  Laterality: N/A;  . NO PAST SURGERIES    . TUBAL LIGATION    . WISDOM TOOTH EXTRACTION      Home Medications:  Allergies as of 06/07/2019   No Known Allergies     Medication List       Accurate as of June 07, 2019  2:08 PM. If you have any questions, ask your nurse or doctor.        levothyroxine 75 MCG tablet Commonly known as: SYNTHROID TAKE 1 TABLET BY MOUTH ONCE DAILY ON AN EMPTY STOMACH WITH A GLASS OF WATER AT LEAST 30 TO 60 MINUTES BEFORE BREAKFAST   valACYclovir 500 MG tablet Commonly known as: Valtrex Take 1 tablet (500 mg total) by mouth daily. Take 1 tablet twice daily for 5 days if outbreak occurs.       Allergies: No Known Allergies  Family History: Family History  Problem Relation Age of Onset  . Hyperlipidemia Father   . Hypertension Paternal Grandfather     Social History:  reports that she has been smoking  cigarettes. She has been smoking about 0.50 packs per day. She has never used smokeless tobacco. She reports current drug use. Drug: Marijuana. She reports that she does not drink alcohol.  ROS: UROLOGY Frequent Urination?: No Hard to postpone urination?: No Burning/pain with urination?: No Get up at night to urinate?: No Leakage of urine?: No Urine stream starts and stops?: No Trouble starting stream?: No Do you have to strain to urinate?: No Blood in urine?: Yes Urinary tract infection?: No Sexually transmitted disease?: No Injury to kidneys or bladder?: No Painful intercourse?: No Weak stream?: No Currently pregnant?: No Vaginal bleeding?: No Last menstrual period?: N/A  Gastrointestinal Nausea?: No Vomiting?: No Indigestion/heartburn?: No Diarrhea?: Yes Constipation?: No  Constitutional Fever: No Night sweats?: No Weight loss?: No Fatigue?: No  Skin Skin rash/lesions?: No Itching?: No  Eyes Blurred vision?: No Double vision?: No  Ears/Nose/Throat Sore throat?: No Sinus problems?: No  Hematologic/Lymphatic Swollen glands?: No Easy bruising?: No  Cardiovascular Leg swelling?: No Chest pain?: No  Respiratory Cough?: No Shortness of breath?: No  Endocrine Excessive thirst?: No  Musculoskeletal Back pain?: No Joint pain?: No  Neurological Headaches?: No Dizziness?: No  Psychologic Depression?: No Anxiety?: No  Physical Exam: BP 120/75 (BP Location: Left Arm, Patient Position: Sitting, Cuff Size: Normal)   Pulse 80  Ht 5\' 2"  (1.575 m)   Wt 105 lb (47.6 kg)   BMI 19.20 kg/m   Constitutional:  Alert and oriented, No acute distress.   Laboratory Data:  Urinalysis Dipstick negative blood/leukocytes Microscopy negative   Assessment & Plan:    - Abnormal urine odor This has resolved and with no abnormalities on urinalysis not pathologic  - Dipstick positive hematuria Based on American Urological Association practice guidelines  asymptomatic microhematuria [facility](AMH) is defined as three or greater red blood cells per high powered field on a properly collected urinary specimen in the absence of an obvious benign cause. A positive dipstick does not define AMH, and evaluation should be based solely on findings from microscopic examination of urinary sediment and not on a dipstick reading.  Urinalysis today shows no abnormal RBCs on microscopy.  Follow-up PRN   Riki AltesScott C Kourtnei Rauber, MD  [facility] [address], [address] [place], [place] [address] [phone]

## 2019-08-30 ENCOUNTER — Other Ambulatory Visit: Payer: Self-pay

## 2019-08-30 ENCOUNTER — Encounter: Payer: Self-pay | Admitting: Obstetrics & Gynecology

## 2019-08-30 ENCOUNTER — Ambulatory Visit (INDEPENDENT_AMBULATORY_CARE_PROVIDER_SITE_OTHER): Payer: Managed Care, Other (non HMO) | Admitting: Obstetrics & Gynecology

## 2019-08-30 VITALS — BP 100/60 | Ht 62.0 in | Wt 108.0 lb

## 2019-08-30 DIAGNOSIS — R102 Pelvic and perineal pain unspecified side: Secondary | ICD-10-CM

## 2019-08-30 DIAGNOSIS — N921 Excessive and frequent menstruation with irregular cycle: Secondary | ICD-10-CM

## 2019-08-30 NOTE — Progress Notes (Signed)
HPI:      [name] is a 28 y.o. [phone] who LMP was Patient's last menstrual period was 08/23/2019., presents today for a problem visit.  She complains of menometrorrhagia that  began several years ago and its severity is described as severe.  She has periods now that are every 2 weeks and recently are becoming more heavy and painful.  Tampons: 15/day first 2 days of period.  They are associated with severe menstrual cramping.  She has used the following for attempts at control: tampon and pad.  Previous evaluation: none. Prior Diagnosis: dysfunctional uterine bleeding. Previous Treatment: none.  She is single partner, contraception - tubal ligation.  Hx of STDs: none. She is premenopausal.  PMHx: She  has a past medical history of Marijuana use, Thyroid disease, and Tobacco abuse. Also,  has a past surgical history that includes No past surgeries; Wisdom tooth extraction; Laparoscopic tubal ligation (N/A, 09/07/2015); and Tubal ligation., family history includes Hyperlipidemia in her father; Hypertension in her paternal grandfather.,  reports that she has been smoking cigarettes. She has been smoking about 0.50 packs per day. She has never used smokeless tobacco. She reports current drug use. Drug: Marijuana. She reports that she does not drink alcohol.  She  Current Outpatient Medications:  .  levothyroxine (SYNTHROID) 75 MCG tablet, TAKE 1 TABLET BY MOUTH ONCE DAILY ON AN EMPTY STOMACH WITH A GLASS OF WATER AT LEAST 30 TO 60 MINUTES BEFORE BREAKFAST, Disp: , Rfl:   Also, has No Known Allergies.  Review of Systems  Constitutional: Negative for chills, fever and malaise/fatigue.  HENT: Negative for congestion, sinus pain and sore throat.   Eyes: Negative for blurred vision and pain.  Respiratory: Negative for cough and wheezing.   Cardiovascular: Negative for chest pain and leg swelling.  Gastrointestinal: Negative for abdominal pain, constipation, diarrhea, heartburn, nausea and  vomiting.  Genitourinary: Negative for dysuria, frequency, hematuria and urgency.  Musculoskeletal: Negative for back pain, joint pain, myalgias and neck pain.  Skin: Negative for itching and rash.  Neurological: Negative for dizziness, tremors and weakness.  Endo/Heme/Allergies: Does not bruise/bleed easily.  Psychiatric/Behavioral: Negative for depression. The patient is not nervous/anxious and does not have insomnia.     Objective: BP 100/60   Ht 5\' 2"  (1.575 m)   Wt 108 lb (49 kg)   LMP 08/23/2019   BMI 19.75 kg/m  Physical Exam Constitutional:      General: She is not in acute distress.    Appearance: She is well-developed.  Musculoskeletal: Normal range of motion.  Neurological:     Mental Status: She is alert and oriented to person, place, and time.  Skin:    General: Skin is warm and dry.  Vitals signs reviewed.     ASSESSMENT/PLAN:  dysfunctional uterine bleeding  Problem List Items Addressed This Visit    Menorrhagia with irregular cycle    -  Primary   Relevant Orders   US PELVIC COMPLETE WITH TRANSVAGINAL   Pelvic pain       Relevant Orders   US PELVIC COMPLETE WITH TRANSVAGINAL    Patient has abnormal uterine bleeding . She has a normal exam today, with no evidence of lesions.  Evaluation includes the following: exam, labs such as hormonal testing, and pelvic ultrasound to evaluate for any structural gynecologic abnormalities.  Patient to follow up after testing.  Treatment option for menorrhagia or menometrorrhagia discussed in great detail with the patient.  Options include hormonal therapy, IUD therapy  such as Mirena, D&C, Ablation, and Hysterectomy.  The pros and cons of each option discussed with patient.  Considering Depo or procedure as tx options  A total of 15 minutes were spent face-to-face with the patient during this encounter and over half of that time dealt with counseling and coordination of care.  Annamarie Major, MD, [facility],  [facility] 08/30/2019  4:04 PM

## 2019-08-30 NOTE — Patient Instructions (Signed)
Abnormal Uterine Bleeding Abnormal uterine bleeding is unusual bleeding from the uterus. It includes:  Bleeding or spotting between periods.  Bleeding after sex.  Bleeding that is heavier than normal.  Periods that last longer than usual.  Bleeding after menopause. Abnormal uterine bleeding can affect women at various stages in life, including teenagers, women in their reproductive years, pregnant women, and women who have reached menopause. Common causes of abnormal uterine bleeding include:  Pregnancy.  Growths of tissue (polyps).  A noncancerous tumor in the uterus (fibroid).  Infection.  Cancer.  Hormonal imbalances. Any type of abnormal bleeding should be evaluated by a health care provider. Many cases are minor and simple to treat, while others are more serious. Treatment will depend on the cause of the bleeding. Follow these instructions at home:  Monitor your condition for any changes.  Do not use tampons, douche, or have sex if told by your health care provider.  Change your pads often.  Get regular exams that include pelvic exams and cervical cancer screening.  Keep all follow-up visits as told by your health care provider. This is important. Contact a health care provider if:  Your bleeding lasts for more than one week.  You feel dizzy at times.  You feel nauseous or you vomit. Get help right away if:  You pass out.  Your bleeding soaks through a pad every hour.  You have abdominal pain.  You have a fever.  You become sweaty or weak.  You pass large blood clots from your vagina. Summary  Abnormal uterine bleeding is unusual bleeding from the uterus.  Any type of abnormal bleeding should be evaluated by a health care provider. Many cases are minor and simple to treat, while others are more serious.  Treatment will depend on the cause of the bleeding. This information is not intended to replace advice given to you by your health care provider.  Make sure you discuss any questions you have with your health care provider. Document Released: 10/14/2005 Document Revised: 01/21/2018 Document Reviewed: 11/15/2016 Elsevier Patient Education  2020 Elsevier Inc.  

## 2019-09-10 ENCOUNTER — Other Ambulatory Visit: Payer: Self-pay | Admitting: Obstetrics & Gynecology

## 2019-09-10 ENCOUNTER — Encounter: Payer: Self-pay | Admitting: Obstetrics & Gynecology

## 2019-09-10 ENCOUNTER — Ambulatory Visit (INDEPENDENT_AMBULATORY_CARE_PROVIDER_SITE_OTHER): Payer: Managed Care, Other (non HMO)

## 2019-09-10 ENCOUNTER — Other Ambulatory Visit: Payer: Self-pay

## 2019-09-10 ENCOUNTER — Ambulatory Visit (INDEPENDENT_AMBULATORY_CARE_PROVIDER_SITE_OTHER): Payer: Managed Care, Other (non HMO) | Admitting: Obstetrics & Gynecology

## 2019-09-10 VITALS — BP 100/60 | Ht 62.0 in | Wt 107.0 lb

## 2019-09-10 DIAGNOSIS — R102 Pelvic and perineal pain unspecified side: Secondary | ICD-10-CM

## 2019-09-10 DIAGNOSIS — N921 Excessive and frequent menstruation with irregular cycle: Secondary | ICD-10-CM

## 2019-09-10 DIAGNOSIS — N83202 Unspecified ovarian cyst, left side: Secondary | ICD-10-CM | POA: Diagnosis not present

## 2019-09-10 MED ORDER — MEDROXYPROGESTERONE ACETATE 150 MG/ML IM SUSP: 150.0000 mg | INTRAMUSCULAR | 3 refills | Status: DC

## 2019-09-10 NOTE — Progress Notes (Signed)
  HPI: Dysfunctional Uterine Bleeding Patient complains of irregular menses. She had been bleeding irregularly. She is now bleeding every 14 days and menses are lasting several days. She changes her pad or tampon every 1 hours. Clots are med in size. Dysmenorrhea:moderate, occurring throughout menses. Cyclic symptoms include: none. Current contraception: tubal ligation. History of infertility: no. History of abnormal Pap smear: normal.  Ultrasound demonstrates no masses seen, cyst seen left ovary, see below  PMHx: She  has a past medical history of Marijuana use, Thyroid disease, and Tobacco abuse. Also,  has a past surgical history that includes No past surgeries; Wisdom tooth extraction; Laparoscopic tubal ligation (N/A, 09/07/2015); and Tubal ligation., family history includes Hyperlipidemia in her father; Hypertension in her paternal grandfather.,  reports that she has been smoking cigarettes. She has been smoking about 0.50 packs per day. She has never used smokeless tobacco. She reports current drug use. Drug: Marijuana. She reports that she does not drink alcohol.  She has a current medication list which includes the following prescription(s): levothyroxine and medroxyprogesterone. Also, has No Known Allergies.  Review of Systems  All other systems reviewed and are negative.   Objective: BP 100/60   Ht 5\' 2"  (1.575 m)   Wt 107 lb (48.5 kg)   LMP 08/23/2019   BMI 19.57 kg/m   Physical examination Constitutional NAD, Conversant  Skin No rashes, lesions or ulceration.   Extremities: Moves all appropriately.  Normal ROM for age. No lymphadenopathy.  Neuro: Grossly intact  Psych: Oriented to PPT.  Normal mood. Normal affect.   US Pelvis Transvaginal Non-ob (tv Only)  Result Date: 09/10/2019 Patient Name: Alexis Parsons DOB: 1991/05/22 MRN: [id number] ULTRASOUND REPORT Location: [facility] Date of Service: 09/10/2019 Indications:Abnormal Uterine Bleeding Findings: The uterus is  anteverted and measures 8.1 x 4.8 x 4.2 cm. Echo texture is homogenous without evidence of focal masses. The Endometrium measures 9.7 mm. Right Ovary measures 3.0 x 1.6 x 1.6 cm. It is normal in appearance. Left Ovary measures 4.0 x 3.3 x 3.2 cm. It is not normal in appearance. There is a complex left ovarian cyst measuring 29 x 29 x 24 mm. No blood flow is seen within this cyst. Survey of the adnexa demonstrates no adnexal masses. There is no free fluid in the cul de sac. Impression: 1. Normal appearing uterus and cervix. 2. Normal appearing right ovary. 3. There is a complex cyst in the left ovary. Recommendations: 1.Clinical correlation with the patient's History and Physical Exam. Gweneth Dimitri, RT Review of ULTRASOUND.    I have personally reviewed images and report of recent ultrasound done at [facility].    Plan of management to be discussed with patient. Barnett Applebaum, MD, [facility], [facility] 09/10/2019  2:12 PM   Assessment:  Menorrhagia with irregular cycle Depo is desired hormonal option for tx. Ablation, hysterectomy, IUD, pills also discussed eRx done  A total of 15 minutes were spent face-to-face with the patient during this encounter and over half of that time dealt with counseling and coordination of care.  Barnett Applebaum, MD, [facility], [facility] 09/10/2019  2:13 PM

## 2019-09-16 ENCOUNTER — Other Ambulatory Visit: Payer: Self-pay

## 2019-09-16 ENCOUNTER — Ambulatory Visit (INDEPENDENT_AMBULATORY_CARE_PROVIDER_SITE_OTHER): Payer: Managed Care, Other (non HMO)

## 2019-09-16 DIAGNOSIS — Z3042 Encounter for surveillance of injectable contraceptive: Secondary | ICD-10-CM

## 2019-09-16 DIAGNOSIS — Z3202 Encounter for pregnancy test, result negative: Secondary | ICD-10-CM | POA: Diagnosis not present

## 2019-09-16 LAB — POCT URINE PREGNANCY: Preg Test, Ur: NEGATIVE

## 2019-09-16 MED ORDER — MEDROXYPROGESTERONE ACETATE 150 MG/ML IM SUSP
150.0000 mg | Freq: Once | INTRAMUSCULAR | Status: AC
  Administered 2019-09-16: 150 mg via INTRAMUSCULAR

## 2019-09-16 NOTE — Progress Notes (Signed)
Pt here for depo which was given IM right glut after neg urine preg test.  NDC# [id number]

## 2019-12-09 ENCOUNTER — Ambulatory Visit: Payer: Managed Care, Other (non HMO)

## 2019-12-13 ENCOUNTER — Ambulatory Visit (INDEPENDENT_AMBULATORY_CARE_PROVIDER_SITE_OTHER): Payer: Managed Care, Other (non HMO)

## 2019-12-13 ENCOUNTER — Other Ambulatory Visit: Payer: Self-pay

## 2019-12-13 DIAGNOSIS — Z3042 Encounter for surveillance of injectable contraceptive: Secondary | ICD-10-CM | POA: Diagnosis not present

## 2019-12-13 MED ORDER — MEDROXYPROGESTERONE ACETATE 150 MG/ML IM SUSP
150.0000 mg | Freq: Once | INTRAMUSCULAR | Status: AC
  Administered 2019-12-13: 150 mg via INTRAMUSCULAR

## 2020-03-06 ENCOUNTER — Ambulatory Visit: Payer: Managed Care, Other (non HMO)

## 2021-03-13 ENCOUNTER — Other Ambulatory Visit: Payer: Self-pay

## 2021-03-13 ENCOUNTER — Encounter: Payer: Self-pay | Admitting: Obstetrics

## 2021-03-13 ENCOUNTER — Ambulatory Visit (INDEPENDENT_AMBULATORY_CARE_PROVIDER_SITE_OTHER): Payer: Managed Care, Other (non HMO) | Admitting: Obstetrics

## 2021-03-13 VITALS — BP 98/58 | Wt 103.0 lb

## 2021-03-13 DIAGNOSIS — R399 Unspecified symptoms and signs involving the genitourinary system: Secondary | ICD-10-CM | POA: Diagnosis not present

## 2021-03-13 LAB — POCT URINALYSIS DIPSTICK
Bilirubin, UA: NEGATIVE
Blood, UA: NEGATIVE
Glucose, UA: NEGATIVE
Ketones, UA: NEGATIVE
Leukocytes, UA: NEGATIVE
Nitrite, UA: NEGATIVE
Protein, UA: NEGATIVE
Spec Grav, UA: 1.015 (ref 1.010–1.025)
Urobilinogen, UA: 0.2 E.U./dL
pH, UA: 6 (ref 5.0–8.0)

## 2021-03-13 MED ORDER — NITROFURANTOIN MONOHYD MACRO 100 MG PO CAPS: 100.0000 mg | ORAL_CAPSULE | Freq: Two times a day (BID) | ORAL | 0 refills | Status: AC

## 2021-03-13 NOTE — Progress Notes (Signed)
Obstetrics & Gynecology Office Visit   Chief Complaint:  Chief Complaint  Patient presents with  . Urinary Tract Infection    Dysuria, foul odor. RM 4    History of Present Illness: Alexis Parsons is worked in today for symptoms of UTI. She developed strong smelling urine about a week. She also reports slight dysuria. She denies any blood seen in her urine. She is married and monogamous. Alexis Parsons has not had a physical in several years due to unemployment and lack of insurance. She smokes cigarettes daily. She is hypothrroid, but stopped taking her Synthroid months ago. She was also seen  By Dr. Tiburcio Pea in 2020 for irregular and heavy periods after having had a BTL, and had one dose of Depo, but never returned for additional dosing due to her complaint that it caused her to have irregular bleeding.  She admits to feeling dehydrated, and not taking in adequate fluids.   Review of Systems:  Review of Systems  Constitutional: Negative.   HENT: Negative.   Eyes: Negative.   Respiratory: Negative.   Cardiovascular: Negative.   Gastrointestinal: Negative.   Genitourinary: Positive for dysuria and urgency.  Musculoskeletal: Negative.   Skin: Negative.   Neurological: Negative.   Endo/Heme/Allergies: Negative.        Hypothyroid and not taking her synthroid  Psychiatric/Behavioral: Negative.      Past Medical History:  Past Medical History:  Diagnosis Date  . Marijuana use   . Thyroid disease   . Tobacco abuse     Past Surgical History:  Past Surgical History:  Procedure Laterality Date  . LAPAROSCOPIC TUBAL LIGATION N/A 09/07/2015   Procedure: LAPAROSCOPIC TUBAL LIGATION;  Surgeon: Vena Austria, MD;  Location: [facility] ORS;  Service: Gynecology;  Laterality: N/A;  . NO PAST SURGERIES    . TUBAL LIGATION    . WISDOM TOOTH EXTRACTION      Gynecologic History: Patient's last menstrual period was 03/05/2021.  Obstetric History: [id number]  Family History:  Family History  Problem  Relation Age of Onset  . Hyperlipidemia Father   . Hypertension Paternal Grandfather     Social History:  Social History   Socioeconomic History  . Marital status: Single    Spouse name: Not on file  . Number of children: Not on file  . Years of education: Not on file  . Highest education level: Not on file  Occupational History  . Not on file  Tobacco Use  . Smoking status: Current Every Day Smoker    Packs/day: 0.50    Types: Cigarettes  . Smokeless tobacco: Never Used  Vaping Use  . Vaping Use: Never used  Substance and Sexual Activity  . Alcohol use: No  . Drug use: Yes    Types: Marijuana    Comment: + UDS 01/30/15, last used pot 1.5 years ago  . Sexual activity: Yes    Birth control/protection: Surgical    Comment: Tubal  Other Topics Concern  . Not on file  Social History Narrative  . Not on file   Social Determinants of Health   Financial Resource Strain: Not on file  Food Insecurity: Not on file  Transportation Needs: Not on file  Physical Activity: Not on file  Stress: Not on file  Social Connections: Not on file  Intimate Partner Violence: Not on file    Allergies:  No Known Allergies  Medications: Prior to Admission medications   Medication Sig Start Date End Date Taking? Authorizing Provider  levothyroxine (SYNTHROID)  75 MCG tablet TAKE 1 TABLET BY MOUTH ONCE DAILY ON AN EMPTY STOMACH WITH A GLASS OF WATER AT LEAST 30 TO 60 MINUTES BEFORE BREAKFAST Patient not taking: Reported on 03/13/2021 03/22/19   [provider]  medroxyPROGESTERone (DEPO-PROVERA) 150 MG/ML injection Inject 1 mL (150 mg total) into the muscle every 3 (three) months. Patient not taking: Reported on 03/13/2021 09/10/19   Nadara Mustard, MD    Physical Exam Vitals:  Vitals:   03/13/21 1604  BP: (!) 98/58   Patient's last menstrual period was 03/05/2021.  Physical Exam Constitutional:      Appearance: Normal appearance.     Comments: Low BMI  HENT:     Head:  Normocephalic and atraumatic.     Nose: Nose normal.  Cardiovascular:     Rate and Rhythm: Normal rate and regular rhythm.     Heart sounds: Normal heart sounds.  Pulmonary:     Effort: Pulmonary effort is normal.     Breath sounds: Normal breath sounds.  Genitourinary:    Comments: Exam deferred Musculoskeletal:        General: Normal range of motion.     Cervical back: Normal range of motion and neck supple.  Skin:    General: Skin is warm and dry.  Neurological:     General: No focal deficit present.     Mental Status: She is alert and oriented to person, place, and time.  Psychiatric:        Mood and Affect: Mood normal.        Behavior: Behavior normal.      Assessment: 30 y.o. O6V6720 with UTI symptoms Tobacco use hypothyroid and off medication.   Plan: Problem List Items Addressed This Visit   None   Visit Diagnoses    UTI symptoms    -  Primary   Relevant Medications   nitrofurantoin, macrocrystal-monohydrate, (MACROBID) 100 MG capsule   Other Relevant Orders   POCT Urinalysis Dipstick (Completed)   Urine Culture     We discussed the import of her "catching up" on her health with a GYN physical (she has an appointment). Addressed her smoking habit and the increased risk of CA, including bladder Cancer. Will start her on Macrobid, and review the urine culture sent today. AZO or Urostat advised as well as greatly increasing her water intake. She is greatly encouraged to re establish care with a Family Practice group and to start on Synthroid.  Mirna Mires, CNM  03/13/2021 4:59 PM \

## 2021-03-15 LAB — URINE CULTURE

## 2021-03-19 ENCOUNTER — Ambulatory Visit: Payer: Managed Care, Other (non HMO) | Admitting: Advanced Practice Midwife

## 2021-03-27 ENCOUNTER — Ambulatory Visit: Payer: Managed Care, Other (non HMO) | Admitting: Advanced Practice Midwife

## 2021-04-03 ENCOUNTER — Ambulatory Visit: Payer: Managed Care, Other (non HMO) | Admitting: Advanced Practice Midwife

## 2022-12-05 ENCOUNTER — Other Ambulatory Visit (HOSPITAL_COMMUNITY)
Admission: RE | Admit: 2022-12-05 | Discharge: 2022-12-05 | Disposition: A | Discharge status: Home / Self Care | Payer: BC Managed Care – PPO | Source: Ambulatory Visit | Attending: Obstetrics and Gynecology | Admitting: Obstetrics and Gynecology

## 2022-12-05 ENCOUNTER — Ambulatory Visit (INDEPENDENT_AMBULATORY_CARE_PROVIDER_SITE_OTHER): Payer: BC Managed Care – PPO | Admitting: Obstetrics and Gynecology

## 2022-12-05 ENCOUNTER — Encounter: Payer: Self-pay | Admitting: Obstetrics and Gynecology

## 2022-12-05 VITALS — BP 112/76 | HR 74 | Resp 16 | Ht 62.5 in | Wt 103.3 lb

## 2022-12-05 DIAGNOSIS — N871 Moderate cervical dysplasia: Secondary | ICD-10-CM | POA: Diagnosis not present

## 2022-12-05 DIAGNOSIS — R87810 Cervical high risk human papillomavirus (HPV) DNA test positive: Secondary | ICD-10-CM | POA: Diagnosis present

## 2022-12-05 DIAGNOSIS — R87612 Low grade squamous intraepithelial lesion on cytologic smear of cervix (LGSIL): Secondary | ICD-10-CM | POA: Diagnosis not present

## 2022-12-05 NOTE — Progress Notes (Signed)
     GYNECOLOGY OFFICE COLPOSCOPY PROCEDURE NOTE  32 y.o. [id number] here for colposcopy for low-grade squamous intraepithelial neoplasia (LGSIL - encompassing HPV,mild dysplasia,CIN I) pap smear on 11/04/2022. Referred from [facility] in [place], however patient has been seen at [facility] in the past. Discussed role for HPV in cervical dysplasia, need for surveillance.  Patient gave informed written consent, time out was performed.  Placed in lithotomy position. Cervix viewed with speculum and colposcope after application of acetic acid.   Colposcopy adequate? Yes Findings: acetowhite lesion(s) noted at 12 o'clock; corresponding biopsies obtained.  ECC specimen obtained. All specimens were labeled and sent to pathology.  Chaperone was present during entire procedure.  Patient was given post procedure instructions.  Will follow up pathology and manage accordingly; patient will be contacted with results and recommendations.  Routine preventative health maintenance measures emphasized.    Rubie Maid, MD [place]

## 2022-12-06 ENCOUNTER — Encounter: Payer: Self-pay | Admitting: Obstetrics and Gynecology

## 2022-12-09 ENCOUNTER — Encounter: Payer: Self-pay | Admitting: Obstetrics and Gynecology

## 2022-12-09 LAB — SURGICAL PATHOLOGY

## 2022-12-16 ENCOUNTER — Ambulatory Visit (INDEPENDENT_AMBULATORY_CARE_PROVIDER_SITE_OTHER): Payer: BC Managed Care – PPO | Admitting: Obstetrics and Gynecology

## 2022-12-16 ENCOUNTER — Encounter: Payer: Self-pay | Admitting: Obstetrics and Gynecology

## 2022-12-16 ENCOUNTER — Other Ambulatory Visit (HOSPITAL_COMMUNITY)
Admission: RE | Admit: 2022-12-16 | Discharge: 2022-12-16 | Disposition: A | Discharge status: Home / Self Care | Payer: BC Managed Care – PPO | Source: Ambulatory Visit | Attending: Obstetrics and Gynecology | Admitting: Obstetrics and Gynecology

## 2022-12-16 VITALS — BP 101/59 | HR 75 | Resp 16 | Ht 62.0 in | Wt 107.4 lb

## 2022-12-16 DIAGNOSIS — N871 Moderate cervical dysplasia: Secondary | ICD-10-CM | POA: Diagnosis not present

## 2022-12-16 NOTE — Progress Notes (Signed)
    GYNECOLOGY OFFICE PROCEDURE NOTE  Alexis Parsons is a 32 y.o. [id number] here for LEEP. No GYN concerns. Pap smear and colposcopy history reviewed.     Pap: LGSIL, HR HPV+ (nt) on 11/04/2022. 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  Role of HPV,cervical dysplasia and need for close followup was empasized. Informed written consent was obtained. All questions were answered. Time out performed. Urine pregnancy test was negative.  ??Procedure: The patient was placed in lithotomy position and the bivalved coated speculum was placed in the patient's vagina. A grounding pad placed on the patient. Lugol's solution was applied to the cervix and areas of decreased uptake were noted around the transformation zone.   Local anesthesia was administered via an intracervical block using 10 ml of 2% Lidocaine with epinephrine. The suction was turned on and the Medium Fisher Cone Biopsy Excisor on 60 Watts of blended current was used to excise the area of decreased uptake and excise the entire transformation zone. Excellent hemostasis was achieved using roller ball coagulation set at 60 Watts coagulation current. Monsel's solution was then applied and the speculum was removed from the vagina. Specimens were sent to pathology.  ?The patient tolerated the procedure well. Post-operative instructions given to patient, including instruction to seek medical attention for persistent bright red bleeding, fever, abdominal/pelvic pain, dysuria, nausea or vomiting. She was also told about the possibility of having  copious yellow to black tinged discharge for weeks. She was counseled to avoid anything in the vagina (sex/douching/tampons) for 3 weeks. She has a 4 week post-operative check to assess wound healing, review results and discuss further management.     Rubie Maid, MD [facility]

## 2022-12-18 LAB — SURGICAL PATHOLOGY

## 2022-12-19 ENCOUNTER — Encounter: Payer: Self-pay | Admitting: Obstetrics and Gynecology

## 2022-12-24 ENCOUNTER — Telehealth: Payer: Self-pay | Admitting: Obstetrics and Gynecology

## 2022-12-24 NOTE — Telephone Encounter (Signed)
The patient is calling because of UTI symptoms, I advised patient would to call her back with an appointment. I offer to today at 4 pm ok per Crystal S. Patient states she is unavailable. I sent patient to triage due to staffing issues for the next couple of days I am unable to scheduled for nurse visit.

## 2023-01-06 ENCOUNTER — Ambulatory Visit: Payer: BC Managed Care – PPO | Admitting: Obstetrics and Gynecology

## 2023-01-14 ENCOUNTER — Encounter: Payer: Self-pay | Admitting: Obstetrics and Gynecology

## 2023-01-14 ENCOUNTER — Ambulatory Visit (INDEPENDENT_AMBULATORY_CARE_PROVIDER_SITE_OTHER): Payer: BC Managed Care – PPO | Admitting: Obstetrics and Gynecology

## 2023-01-14 VITALS — BP 107/74 | HR 73 | Resp 16 | Ht 63.0 in | Wt 106.2 lb

## 2023-01-14 DIAGNOSIS — N871 Moderate cervical dysplasia: Secondary | ICD-10-CM

## 2023-01-14 DIAGNOSIS — R87612 Low grade squamous intraepithelial lesion on cytologic smear of cervix (LGSIL): Secondary | ICD-10-CM

## 2023-01-14 NOTE — Progress Notes (Signed)
    OBSTETRICS/GYNECOLOGY POST-OPERATIVE CLINIC VISIT  Subjective:     Alexis Parsons is a 32 y.o. female who presents to the clinic 4 weeks status post LEEP for .  Eating a regular diet without difficulty. Bowel movements are abnormal with constipation . The patient is not having any pain. She has been having constant yellowish, brownish discharge since she had her leep. She wears a panty liner to control the discharge. Denies foul odor or abnormal bleeding. Does mention having a cycle recently.    Pap smear and colposcopy history reviewed.     Pap: LGSIL, HR HPV+ (nt) on 11/04/2022. 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    Review of Systems Pertinent items are noted in HPI.   Objective:   BP 107/74   Pulse 73   Resp 16   Ht 5\' 3"  (1.6 m)   Wt 106 lb 3.2 oz (48.2 kg)   LMP 01/07/2023 (Exact Date)   BMI 18.81 kg/m  Body mass index is 18.81 kg/m.  General:  alert and no distress  Abdomen: soft, bowel sounds active, non-tender  Incision:   healing well, no drainage, no erythema, no hernia, no seroma, no swelling, no dehiscence, incision well approximated    Pathology:  FINAL MICROSCOPIC DIAGNOSIS:  A. ENDOCERVIX, LEEP: - Benign cervical glandular mucosa - Negative for dysplasia or malignancy  B. ECTOCERVIX, LEEP: - High-grade squamous intraepithelial lesion (CIN2, high grade dysplasia) - The ectocervical margin is focally positive for high-grade dysplasia  C. ENDOCERVIX, CURETTAGE: - Benign cervical glandular mucosa - Negative for dysplasia or malignancy   Assessment:   Patient  s/p LEEP (surgery)  Doing well postoperatively.   Plan:   1. Wound care discussed. 2. Pathology report discussed. Advised on f/u in 6 months for repeat pap smear.  3. Activity restrictions: none 4. Anticipated return to work: not applicable. 5. Given information in AVS regarding HPV vaccine.      Rubie Maid, MD [place]

## 2023-12-02 DIAGNOSIS — F329 Major depressive disorder, single episode, unspecified: Secondary | ICD-10-CM | POA: Insufficient documentation

## 2023-12-02 DIAGNOSIS — E039 Hypothyroidism, unspecified: Secondary | ICD-10-CM | POA: Insufficient documentation

## 2024-03-31 ENCOUNTER — Encounter: Payer: Self-pay | Admitting: Advanced Practice Midwife

## 2024-03-31 ENCOUNTER — Ambulatory Visit (INDEPENDENT_AMBULATORY_CARE_PROVIDER_SITE_OTHER): Admitting: Advanced Practice Midwife

## 2024-03-31 ENCOUNTER — Other Ambulatory Visit (HOSPITAL_COMMUNITY)
Admission: RE | Admit: 2024-03-31 | Discharge: 2024-03-31 | Disposition: A | Discharge status: Home / Self Care | Source: Ambulatory Visit | Attending: Advanced Practice Midwife | Admitting: Advanced Practice Midwife

## 2024-03-31 VITALS — BP 107/70 | HR 65 | Ht 62.5 in | Wt 111.5 lb

## 2024-03-31 DIAGNOSIS — Z01419 Encounter for gynecological examination (general) (routine) without abnormal findings: Secondary | ICD-10-CM | POA: Insufficient documentation

## 2024-03-31 DIAGNOSIS — R6882 Decreased libido: Secondary | ICD-10-CM

## 2024-03-31 DIAGNOSIS — Z124 Encounter for screening for malignant neoplasm of cervix: Secondary | ICD-10-CM | POA: Diagnosis present

## 2024-03-31 DIAGNOSIS — E039 Hypothyroidism, unspecified: Secondary | ICD-10-CM

## 2024-03-31 NOTE — Patient Instructions (Signed)
 Motherwort- herbal treatment may help low libido

## 2024-03-31 NOTE — Progress Notes (Signed)
 [facility]   Gynecology Annual Exam   PCP: Alexis Parsons, No Pcp Per  Chief Complaint:  Chief Complaint  Alexis Parsons presents with   Annual Exam    History of Present Illness: Alexis Parsons is a [name] presents for annual exam. The Alexis Parsons has complaint today of low libido for the past 8 years- ever since the birth of her daughter. She reports sexual activity usually once a month and is ok once she is active, however, has little interest in initiating. She reports little stress, generally good mood, has quit smoking cigarettes about 4 months ago- gained 11 pounds since then, diet is usually fried/fast foods, hydration with flavored water, daily use of marijuana.   Upon further review of chart after visit I discovered she had thyroid level checked in February and was found to be elevated at 110. Provider recommended she restart her thyroid medication and have level rechecked in May. I sent her a message to come for lab-only visit for redraw.   We discussed the complex nature of low libido. Information regarding the same from the [facility] given to the Alexis Parsons. She is not interested in therapy and does not want to take medication. Recommended lifestyle changes primarily diet, and suggested she try Motherwort- herbal support and supplement of Omega 3 fatty acids. She agrees to hormone lab levels today.  LMP: Alexis Parsons's last menstrual period was 03/27/2024 (approximate). Average Interval: regular, 28 days Duration of flow: 4 days Heavy Menses: first 2 days Clots: no Intermenstrual Bleeding: no Postcoital Bleeding: no Dysmenorrhea: no  The Alexis Parsons is sexually active. She currently uses tubal ligation for contraception. She denies dyspareunia.  The Alexis Parsons does perform self breast exams.  There is no notable family history of breast or ovarian cancer in her family.  The Alexis Parsons wears seatbelts: yes.   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 She admits adequate sleep.    The Alexis Parsons denies current symptoms of depression. Was taking Effexor and felt it was bringing her mood down. She feels better off the medication.    Review of Systems: Review of Systems  Constitutional:  Negative for chills and fever.  HENT:  Negative for congestion, ear discharge, ear pain, hearing loss, sinus pain and sore throat.   Eyes:  Negative for blurred vision and double vision.  Respiratory:  Negative for cough, shortness of breath and wheezing.   Cardiovascular:  Negative for chest pain, palpitations and leg swelling.  Gastrointestinal:  Negative for abdominal pain, blood in stool, constipation, diarrhea, heartburn, melena, nausea and vomiting.  Genitourinary:  Negative for dysuria, flank pain, frequency, hematuria and urgency.  Musculoskeletal:  Negative for back pain, joint pain and myalgias.  Skin:  Negative for itching and rash.  Neurological:  Negative for dizziness, tingling, tremors, sensory change, speech change, focal weakness, seizures, loss of consciousness, weakness and headaches.  Endo/Heme/Allergies:  Negative for environmental allergies. Does not bruise/bleed easily.       Positive for low libido  Psychiatric/Behavioral:  Negative for depression, hallucinations, memory loss, substance abuse and suicidal ideas. The Alexis Parsons is not nervous/anxious and does not have insomnia.      Past Medical History:  Alexis Parsons Active Problem List   Diagnosis Date Noted Date Diagnosed   Hypothyroidism 12/02/2023    Major depressive disorder 12/02/2023     Past Surgical History:  Past Surgical History:  Procedure Laterality  Date   LAPAROSCOPIC TUBAL LIGATION N/A 09/07/2015   Procedure: LAPAROSCOPIC TUBAL LIGATION;  Surgeon: Darl Edu, MD;  Location: [facility] ORS;  Service: Gynecology;  Laterality: N/A;   NO PAST SURGERIES     TUBAL LIGATION     WISDOM TOOTH EXTRACTION       Gynecologic History:  Alexis Parsons's last menstrual period was 03/27/2024 (approximate). Contraception: tubal ligation Last Pap: 11/04/22 Results were: low-grade squamous intraepithelial neoplasia (LGSIL - encompassing HPV,mild dysplasia,CIN I)  Had LEEP procedure 12/16/22 after colposcopy 12/05/22  Obstetric History: [id number]  Family History:  Family History  Problem Relation Age of Onset   Hyperlipidemia Father    Diabetes Paternal Uncle    Hypertension Paternal Grandfather     Social History:  Social History   Socioeconomic History   Marital status: Single    Spouse name: Not on file   Number of children: Not on file   Years of education: Not on file   Highest education level: Not on file  Occupational History   Not on file  Tobacco Use   Smoking status: Former    Current packs/day: 0.00    Types: Cigarettes    Quit date: 12/18/2023    Years since quitting: 0.2   Smokeless tobacco: Never  Vaping Use   Vaping status: Never Used  Substance and Sexual Activity   Alcohol use: No   Drug use: Yes    Types: Marijuana    Comment: + UDS 01/30/15, last used pot 1.5 years ago   Sexual activity: Yes    Birth control/protection: Surgical    Comment: Tubal  Other Topics Concern   Not on file  Social History Narrative   Not on file   Social Drivers of Health   Financial Resource Strain: Low Risk  (12/02/2023)   Received from [facility]   Overall Financial Resource Strain (CARDIA)    Difficulty of Paying Living Expenses: Not hard at all  Food Insecurity: No Food Insecurity (12/02/2023)   Received from [facility]   Hunger Vital Sign    Worried About Running Out of Food in the Last Year: Never true    Ran Out of Food in the Last Year: Never true  Transportation Needs: No Transportation Needs (12/02/2023)   Received from [facility] - Transportation    In the past 12 months, has lack of transportation kept you from  medical appointments or from getting medications?: No    Lack of Transportation (Non-Medical): No  Physical Activity: Not on file  Stress: Not on file  Social Connections: Not on file  Intimate Partner Violence: Not on file    Allergies:  No Known Allergies  Medications: Prior to Admission medications   Medication Sig Start Date End Date Taking? Authorizing Provider  levothyroxine (SYNTHROID) 88 MCG tablet Take 88 mcg by mouth every morning.   Yes [provider]    Physical Exam Vitals: Blood pressure 107/70, pulse 65, height 5' 2.5" (1.588 m), weight 111 lb 8 oz (50.6 kg), last menstrual period 03/27/2024  General: NAD HEENT: normocephalic, anicteric Thyroid: no enlargement, no palpable nodules Pulmonary: No increased work of breathing, CTAB Cardiovascular: RRR, distal pulses 2+ Breast: Breast symmetrical, no tenderness, no palpable nodules or masses, no skin or nipple retraction present, no nipple discharge.  No axillary or supraclavicular lymphadenopathy. Abdomen: NABS, soft, non-tender, non-distended.  Umbilicus without lesions.  No hepatomegaly, splenomegaly or masses palpable. No evidence of hernia  Genitourinary:  External: Normal external female genitalia.  Normal urethral meatus, normal Bartholin's and Skene's glands.    Vagina: Normal vaginal mucosa, no evidence of prolapse.    Cervix: Grossly normal in appearance, no bleeding  Uterus: Non-enlarged, mobile, normal contour.  No CMT  Adnexa: ovaries non-enlarged, no adnexal masses  Rectal: deferred  Lymphatic: no evidence of inguinal lymphadenopathy Extremities: no edema, erythema, or tenderness Neurologic: Grossly intact Psychiatric: mood appropriate, affect full   Assessment: 33 y.o. [id number] routine annual exam  Plan: Problem List Items Addressed This Visit       Endocrine   Hypothyroidism   Relevant Orders   Estradiol   Progesterone   Other Visit Diagnoses       Well woman exam with routine  gynecological exam    -  Primary   Relevant Orders   Cytology - PAP     Low libido       Relevant Orders   Estradiol   Progesterone     Screening for cervical cancer       Relevant Orders   Cytology - PAP       1) STI screening  was offered and declined  2)  ASCCP guidelines and rationale discussed.  Alexis Parsons opts for every 3 years screening interval following normal PAP  3) Contraception - the Alexis Parsons is currently using  tubal ligation.  She is happy with her current form of contraception and plans to continue  4) Routine healthcare maintenance including cholesterol, diabetes screening discussed : check hormone levels today (low libido)  5) Increase healthy lifestyle; diet, hydration, discontinue marijuana use  6) Consider herbal support/supplement of Omega 3 Fatty Acids for low libido  7) Return in about 1 year (around 03/31/2025) for annual established gyn and for lab-only visit soon for TSH level   Angelita Kendall, CNM [facility] 03/31/2024 6:22 PM

## 2024-04-01 LAB — PROGESTERONE: Progesterone: 0.3 ng/mL

## 2024-04-01 LAB — ESTRADIOL: Estradiol: 100 pg/mL

## 2024-04-02 LAB — CYTOLOGY - PAP
Comment: NEGATIVE
Diagnosis: NEGATIVE
High risk HPV: NEGATIVE

## 2024-04-12 ENCOUNTER — Ambulatory Visit: Payer: Self-pay | Admitting: Advanced Practice Midwife
